# Patient Record
Sex: Female | Born: 1945
Health system: Southern US, Community
[De-identification: ages and names within clinical notes are randomized; demographics above are authoritative.]

## PROBLEM LIST (undated history)

## (undated) DIAGNOSIS — H35039 Hypertensive retinopathy, unspecified eye: Secondary | ICD-10-CM

## (undated) DIAGNOSIS — I4891 Unspecified atrial fibrillation: Secondary | ICD-10-CM

## (undated) DIAGNOSIS — H269 Unspecified cataract: Secondary | ICD-10-CM

## (undated) DIAGNOSIS — H3530 Unspecified macular degeneration: Secondary | ICD-10-CM

## (undated) DIAGNOSIS — K5190 Ulcerative colitis, unspecified, without complications: Secondary | ICD-10-CM

## (undated) DIAGNOSIS — H8109 Meniere's disease, unspecified ear: Secondary | ICD-10-CM

## (undated) DIAGNOSIS — I7300 Raynaud's syndrome without gangrene: Secondary | ICD-10-CM

## (undated) DIAGNOSIS — I499 Cardiac arrhythmia, unspecified: Secondary | ICD-10-CM

## (undated) HISTORY — DX: Unspecified atrial fibrillation: I48.91

## (undated) HISTORY — DX: Cardiac arrhythmia, unspecified: I49.9

## (undated) HISTORY — DX: Unspecified macular degeneration: H35.30

## (undated) HISTORY — PX: TONSILLECTOMY AND ADENOIDECTOMY: SUR1326

## (undated) HISTORY — DX: Hypertensive retinopathy, unspecified eye: H35.039

## (undated) HISTORY — DX: Ulcerative colitis, unspecified, without complications: K51.90

## (undated) HISTORY — DX: Raynaud's syndrome without gangrene: I73.00

## (undated) HISTORY — DX: Meniere's disease, unspecified ear: H81.09

## (undated) HISTORY — DX: Unspecified cataract: H26.9

---

## 2011-12-01 ENCOUNTER — Encounter: Payer: Self-pay | Admitting: *Deleted

## 2011-12-01 ENCOUNTER — Ambulatory Visit (INDEPENDENT_AMBULATORY_CARE_PROVIDER_SITE_OTHER): Payer: Medicare Other | Admitting: Cardiology

## 2011-12-01 ENCOUNTER — Inpatient Hospital Stay: Admission: RE | Admit: 2011-12-01 | Payer: Self-pay | Source: Ambulatory Visit

## 2011-12-01 ENCOUNTER — Encounter: Payer: Self-pay | Admitting: Cardiology

## 2011-12-01 VITALS — BP 168/82 | HR 60 | Ht 62.0 in | Wt 127.0 lb

## 2011-12-01 DIAGNOSIS — IMO0002 Reserved for concepts with insufficient information to code with codable children: Secondary | ICD-10-CM | POA: Insufficient documentation

## 2011-12-01 DIAGNOSIS — R1900 Intra-abdominal and pelvic swelling, mass and lump, unspecified site: Secondary | ICD-10-CM

## 2011-12-01 DIAGNOSIS — I10 Essential (primary) hypertension: Secondary | ICD-10-CM | POA: Insufficient documentation

## 2011-12-01 DIAGNOSIS — I4891 Unspecified atrial fibrillation: Secondary | ICD-10-CM

## 2011-12-01 MED ORDER — METOPROLOL TARTRATE 25 MG PO TABS: 25.0000 mg | ORAL_TABLET | Freq: Two times a day (BID) | ORAL | Status: DC

## 2011-12-01 MED ORDER — FLECAINIDE ACETATE 50 MG PO TABS: 50.0000 mg | ORAL_TABLET | Freq: Two times a day (BID) | ORAL | Status: DC

## 2011-12-01 NOTE — Patient Instructions (Addendum)
Your physician recommends that you schedule a follow-up appointment in: 1 year. You will receive a reminder letter in the mail in about 10 months reminding you to call and schedule your appointment. If you don't receive this letter, please contact our office.   Your physician recommends that you continue on your current medications as directed. Please refer to the Current Medication list given to you today.   Your physician has requested that you have an abdominal duplex. During this test, an ultrasound is used to evaluate the aorta. Allow 30 minutes for this exam. Do not eat after midnight the day before and avoid carbonated beverages

## 2011-12-01 NOTE — Assessment & Plan Note (Signed)
Her blood pressure is elevated but this is unusual. She will continue with surveillance and we instructed her on a blood pressure diary. Further treatment will be based on these results.

## 2011-12-01 NOTE — Progress Notes (Signed)
HPI The patient presents as a new patient. She has a long history of paroxysmal atrial fibrillation. She develops this rhythm when she is sick and she has been ill a few times this year with flulike illnesses. She has been treated with low-dose flecainide. She has not tolerated higher doses. She has more recently been put on a low dose of beta blocker. She has had an extensive workup by another cardiology group. Most recently in 2011 echo was unremarkable. Stress perfusion study demonstrated a preserved ejection fraction with no ischemia or infarct. She was hospitalized earlier this year with a flulike illness and had atrial fibrillation. She is active. She walks her driveway which is a long driveway and with an incline frequently. The patient denies any new symptoms such as chest discomfort, neck or arm discomfort. There has been no new shortness of breath, PND or orthopnea. There have been no reported palpitations, presyncope or syncope.  Allergies  Allergen Reactions  . Penicillins   . Asa (Aspirin) Swelling    Throat,eyes  . Bactrim (Sulfamethoxazole W-Trimethoprim) Swelling    face  . Biaxin (Clarithromycin) Swelling    face  . Cortisone Swelling    face  . Erythromycin Other (See Comments)    Sores on face  . Tylenol (Acetaminophen) Swelling    face    Current Outpatient Prescriptions  Medication Sig Dispense Refill  . flecainide (TAMBOCOR) 50 MG tablet Take 50 mg by mouth 2 (two) times daily.      . meclizine (ANTIVERT) 12.5 MG tablet Take 12.5 mg by mouth 3 (three) times daily as needed.      . metoprolol tartrate (LOPRESSOR) 25 MG tablet Take 25 mg by mouth 2 (two) times daily.        Past Medical History  Diagnosis Date  . Arrhythmia     afib    No past surgical history on file.  No family history on file.  History   Social History  . Marital Status: Married    Spouse Name: N/A    Number of Children: N/A  . Years of Education: N/A   Occupational History  .  Not on file.   Social History Main Topics  . Smoking status: Former Smoker -- 1.0 packs/day for 10 years    Types: Cigarettes    Quit date: 06/29/1969  . Smokeless tobacco: Never Used  . Alcohol Use: Not on file  . Drug Use: Not on file  . Sexually Active: Not on file   Other Topics Concern  . Not on file   Social History Narrative  . No narrative on file    ROS:  As stated in the HPI and negative for all other systems.   PHYSICAL EXAM BP 168/82  Pulse 60  Ht 5\' 2"  (1.575 m)  Wt 127 lb (57.607 kg)  BMI 23.23 kg/m2 GENERAL:  Well appearing HEENT:  Pupils equal round and reactive, fundi not visualized, oral mucosa unremarkable NECK:  No jugular venous distention, waveform within normal limits, carotid upstroke brisk and symmetric, no bruits, no thyromegaly LYMPHATICS:  No cervical, inguinal adenopathy LUNGS:  Clear to auscultation bilaterally BACK:  No CVA tenderness CHEST:  Unremarkable HEART:  PMI not displaced or sustained,S1 and S2 within normal limits, no S3, no S4, no clicks, no rubs, no murmurs ABD:  Flat, positive bowel sounds normal in frequency in pitch, no bruits, no rebound, no guarding, possible midline pulsatile mass, no hepatomegaly, no splenomegaly EXT:  2 plus pulses throughout, no  edema, no cyanosis no clubbing SKIN:  No rashes no nodules NEURO:  Cranial nerves II through XII grossly intact, motor grossly intact throughout PSYCH:  Cognitively intact, oriented to person place and time   EKG:  Normal sinus rhythm, rate 58, leftward axis, nonspecific anterior T wave changes. 12/01/11  ASSESSMENT AND PLAN

## 2011-12-01 NOTE — Assessment & Plan Note (Signed)
Given her family history and this finding she needs to be screened with an abdominal ultrasound.

## 2011-12-01 NOTE — Assessment & Plan Note (Signed)
Currently she is on reasonable therapy. She has no significant risk for thromboembolism so systemic anticoagulation is not indicated. She will continue medications as listed.

## 2011-12-08 ENCOUNTER — Telehealth: Payer: Self-pay | Admitting: Cardiology

## 2011-12-08 MED ORDER — METOPROLOL SUCCINATE ER 25 MG PO TB24: 50.0000 mg | ORAL_TABLET | Freq: Every day | ORAL | Status: DC

## 2011-12-08 NOTE — Telephone Encounter (Signed)
New msg Pt just got metoprolol rx and the pills are different. Please call her back

## 2011-12-08 NOTE — Telephone Encounter (Signed)
Pt reports that she has been taking Metoprolol succinate 25 mg tablets - two a day in the AM - when she picked up her last RX she was given tartrate.  A corrected RX will be sent into pharmacy

## 2011-12-09 ENCOUNTER — Other Ambulatory Visit: Payer: Self-pay

## 2011-12-09 ENCOUNTER — Other Ambulatory Visit: Payer: Self-pay | Admitting: Cardiology

## 2011-12-09 DIAGNOSIS — R1900 Intra-abdominal and pelvic swelling, mass and lump, unspecified site: Secondary | ICD-10-CM

## 2011-12-30 ENCOUNTER — Telehealth: Payer: Self-pay | Admitting: *Deleted

## 2011-12-30 ENCOUNTER — Other Ambulatory Visit: Payer: Self-pay | Admitting: Cardiology

## 2011-12-30 DIAGNOSIS — R1900 Intra-abdominal and pelvic swelling, mass and lump, unspecified site: Secondary | ICD-10-CM

## 2011-12-30 NOTE — Telephone Encounter (Signed)
Message copied by Megan Salon on Wed Dec 30, 2011  5:11 PM ------      Message from: Eustace Moore      Created: Mon Dec 07, 2011 10:08 AM      Regarding: ABD U/S       Have you gotten any word about whether or not this test can be scheduled?

## 2011-12-30 NOTE — Telephone Encounter (Signed)
Checking with Harriett in the Vascular Lab as to how to place this order.

## 2012-01-01 NOTE — Telephone Encounter (Signed)
Noted  

## 2012-01-07 ENCOUNTER — Encounter (INDEPENDENT_AMBULATORY_CARE_PROVIDER_SITE_OTHER): Payer: Medicare Other

## 2012-01-07 DIAGNOSIS — R1900 Intra-abdominal and pelvic swelling, mass and lump, unspecified site: Secondary | ICD-10-CM

## 2012-01-07 DIAGNOSIS — I10 Essential (primary) hypertension: Secondary | ICD-10-CM

## 2012-01-12 ENCOUNTER — Telehealth: Payer: Self-pay | Admitting: *Deleted

## 2012-01-12 NOTE — Telephone Encounter (Signed)
Patient informed. 

## 2012-01-12 NOTE — Telephone Encounter (Signed)
Message copied by Eustace Moore on Tue Jan 12, 2012 10:49 AM ------      Message from: Rollene Rotunda      Created: Mon Jan 11, 2012 11:38 AM       No evidence of AAA.  Normal study.

## 2012-12-05 ENCOUNTER — Encounter: Payer: Self-pay | Admitting: Cardiology

## 2012-12-05 ENCOUNTER — Ambulatory Visit (INDEPENDENT_AMBULATORY_CARE_PROVIDER_SITE_OTHER): Payer: Medicare Other | Admitting: Cardiology

## 2012-12-05 VITALS — BP 176/76 | HR 89 | Ht 61.0 in | Wt 130.8 lb

## 2012-12-05 DIAGNOSIS — I10 Essential (primary) hypertension: Secondary | ICD-10-CM

## 2012-12-05 DIAGNOSIS — I4891 Unspecified atrial fibrillation: Secondary | ICD-10-CM

## 2012-12-05 MED ORDER — METOPROLOL SUCCINATE ER 25 MG PO TB24: 50.0000 mg | ORAL_TABLET | Freq: Every day | ORAL | Status: DC

## 2012-12-05 MED ORDER — MECLIZINE HCL 12.5 MG PO TABS: 12.5000 mg | ORAL_TABLET | Freq: Three times a day (TID) | ORAL | Status: DC | PRN

## 2012-12-05 MED ORDER — FLECAINIDE ACETATE 50 MG PO TABS: 50.0000 mg | ORAL_TABLET | Freq: Two times a day (BID) | ORAL | Status: DC

## 2012-12-05 NOTE — Patient Instructions (Addendum)
Your physician recommends that you schedule a follow-up appointment in: 1 year. You will receive a reminder letter in the mail in about 10 months reminding you to call and schedule your appointment. If you don't receive this letter, please contact our office. Your physician recommends that you continue on your current medications as directed. Please refer to the Current Medication list given to you today. Your physician recommends that you return for lab work today for [employer], and BMET. Please have this done at [facility].

## 2012-12-05 NOTE — Progress Notes (Signed)
HPI The patient presents as a new patient. She has a long history of paroxysmal atrial fibrillation. She develops this rhythm when she is sick and she has been ill a few times this year with flulike illnesses. She has been treated with low-dose flecainide. She has not tolerated higher doses. She has also been on a low dose of beta blocker. She has had an extensive workup by another cardiology group. Most recently in 2011 echo was unremarkable. Stress perfusion study demonstrated a preserved ejection fraction with no ischemia or infarct. She was hospitalized earlier this year with a flulike illness and had atrial fibrillation.   Since I last saw her she has been getting some occasional morning palpitations.  These happens sporadically. She'll go back to lay down to go away.  She cannot bring this on. She does not have any presyncope or syncope. She hasn't had any sustained tachycardia arrhythmias. She denies any chest pressure, neck or arm discomfort. She has no shortness of breath, PND or orthopnea.  Allergies  Allergen Reactions  . Penicillins   . Asa (Aspirin) Swelling    Throat,eyes  . Bactrim (Sulfamethoxazole W-Trimethoprim) Swelling    face  . Biaxin (Clarithromycin) Swelling    face  . Cortisone Swelling    face  . Erythromycin Other (See Comments)    Sores on face  . Tylenol (Acetaminophen) Swelling    face    Current Outpatient Prescriptions  Medication Sig Dispense Refill  . flecainide (TAMBOCOR) 50 MG tablet Take 1 tablet (50 mg total) by mouth 2 (two) times daily.  180 tablet  3  . meclizine (ANTIVERT) 12.5 MG tablet Take 12.5 mg by mouth 3 (three) times daily as needed.      . metoprolol succinate (TOPROL-XL) 25 MG 24 hr tablet Take 2 tablets (50 mg total) by mouth daily.  180 tablet  3   No current facility-administered medications for this visit.    Past Medical History  Diagnosis Date  . Arrhythmia     afib  . Ulcerative colitis   . Raynaud disease   . Meniere  disease     Past Surgical History  Procedure Laterality Date  . Tonsillectomy and adenoidectomy      Family History  Problem Relation Age of Onset  . Aortic aneurysm Mother   . Aortic aneurysm Maternal Aunt   . Lung disease Father   . Coronary artery disease Paternal Grandmother 5    died    History   Social History  . Marital Status: Married    Spouse Name: N/A    Number of Children: 4  . Years of Education: N/A   Occupational History  . Not on file.   Social History Main Topics  . Smoking status: Former Smoker -- 1.00 packs/day for 10 years    Types: Cigarettes    Quit date: 06/29/1969  . Smokeless tobacco: Never Used  . Alcohol Use: Not on file  . Drug Use: Not on file  . Sexually Active: Not on file   Other Topics Concern  . Not on file   Social History Narrative   Lives with husband and oldest son.          ROS:  As stated in the HPI and negative for all other systems.   PHYSICAL EXAM BP 176/76  Pulse 89  Ht 5\' 1"  (1.549 m)  Wt 130 lb 12.8 oz (59.33 kg)  BMI 24.73 kg/m2  SpO2 96% GENERAL:  Well appearing HEENT:  Pupils equal round and reactive, fundi not visualized, oral mucosa unremarkable NECK:  No jugular venous distention, waveform within normal limits, carotid upstroke brisk and symmetric, no bruits, no thyromegaly LYMPHATICS:  No cervical, inguinal adenopathy LUNGS:  Clear to auscultation bilaterally BACK:  No CVA tenderness CHEST:  Unremarkable HEART:  PMI not displaced or sustained,S1 and S2 within normal limits, no S3, no S4, no clicks, no rubs, no murmurs ABD:  Flat, positive bowel sounds normal in frequency in pitch, no bruits, no rebound, no guarding, possible midline pulsatile mass, no hepatomegaly, no splenomegaly EXT:  2 plus pulses throughout, no edema, no cyanosis no clubbing SKIN:  No rashes no nodules NEURO:  Cranial nerves II through XII grossly intact, motor grossly intact throughout PSYCH:  Cognitively intact, oriented to  person place and time   EKG:  Normal sinus rhythm, rate 60, leftward axis, nonspecific anterior T wave changes. No significant change from previous. 12/01/11  ASSESSMENT AND PLAN   ATRIAL FIBRILLATION:  She's had no sustained paroxysms symptomatic this. She does have some a.m. Palpitations. I will have her split her Toprol to one pill in the morning and 1 in the evening to see if this helps. I will be checking a TSH magnesium and potassium today.  HTN:  BP OK.  Continue current therapy.

## 2012-12-08 ENCOUNTER — Telehealth: Payer: Self-pay | Admitting: *Deleted

## 2012-12-08 NOTE — Telephone Encounter (Signed)
Message copied by Eustace Moore on Thu Dec 08, 2012  9:37 AM ------      Message from: Rollene Rotunda      Created: Wed Dec 07, 2012  1:43 PM       Labs OK.  Call Ms. Towery with the results and send results to [facility], MD       ------

## 2012-12-08 NOTE — Telephone Encounter (Signed)
Patient informed and copy sent to PCP. 

## 2013-02-01 ENCOUNTER — Other Ambulatory Visit: Payer: Self-pay

## 2013-05-04 ENCOUNTER — Other Ambulatory Visit: Payer: Self-pay

## 2013-12-06 ENCOUNTER — Other Ambulatory Visit: Payer: Self-pay | Admitting: *Deleted

## 2013-12-06 MED ORDER — FLECAINIDE ACETATE 50 MG PO TABS: 50.0000 mg | ORAL_TABLET | Freq: Two times a day (BID) | ORAL | Status: DC

## 2013-12-06 MED ORDER — METOPROLOL SUCCINATE ER 25 MG PO TB24: 50.0000 mg | ORAL_TABLET | Freq: Every day | ORAL | Status: DC

## 2013-12-22 ENCOUNTER — Encounter: Payer: Self-pay | Admitting: Cardiology

## 2013-12-22 ENCOUNTER — Ambulatory Visit (INDEPENDENT_AMBULATORY_CARE_PROVIDER_SITE_OTHER): Payer: Medicare Other | Admitting: Cardiology

## 2013-12-22 VITALS — BP 148/84 | HR 67 | Ht 61.0 in | Wt 129.0 lb

## 2013-12-22 DIAGNOSIS — I480 Paroxysmal atrial fibrillation: Secondary | ICD-10-CM

## 2013-12-22 DIAGNOSIS — I4891 Unspecified atrial fibrillation: Secondary | ICD-10-CM

## 2013-12-22 MED ORDER — FLECAINIDE ACETATE 50 MG PO TABS: 50.0000 mg | ORAL_TABLET | Freq: Two times a day (BID) | ORAL | Status: DC

## 2013-12-22 MED ORDER — METOPROLOL SUCCINATE ER 25 MG PO TB24: 25.0000 mg | ORAL_TABLET | Freq: Two times a day (BID) | ORAL | Status: DC

## 2013-12-22 NOTE — Patient Instructions (Signed)
The current medical regimen is effective;  continue present plan and medications.  Follow up in 1 year with Dr. Hochrein in the [place] office.  You will receive a letter in the mail 2 months before you are due.  Please call us when you receive this letter to schedule your follow up appointment.  

## 2013-12-22 NOTE — Progress Notes (Signed)
   HPI The patient presents for followup of atrial fibrillation.   She has a long history of paroxysmal atrial fibrillation. She develops this rhythm when she is sick. She has been treated with low-dose flecainide. She has not tolerated higher doses. She has also been on a low dose of beta blocker. She has had an extensive workup by another cardiology group. Most recently in 2011 echo was unremarkable. Stress perfusion study demonstrated a preserved ejection fraction with no ischemia or infarct.   Since I last saw her she has been getting some occasional morning palpitations.  The she had one episode of chest pain and pain going down her arm in the morning. However, this was an isolated event it was self limited. She's otherwise the walking for exercise or pushing the vacuum if she has not had any further chest discomfort, neck or arm discomfort. She's had no presyncope or syncope.  Allergies  Allergen Reactions  . Penicillins   . Asa [Aspirin] Swelling    Throat,eyes  . Bactrim [Sulfamethoxazole-Trimethoprim] Swelling    face  . Biaxin [Clarithromycin] Swelling    face  . Cortisone Swelling    face  . Erythromycin Other (See Comments)    Sores on face  . Tylenol [Acetaminophen] Swelling    face    Current Outpatient Prescriptions  Medication Sig Dispense Refill  . flecainide (TAMBOCOR) 50 MG tablet Take 1 tablet (50 mg total) by mouth 2 (two) times daily.  60 tablet  0  . meclizine (ANTIVERT) 12.5 MG tablet Take 1 tablet (12.5 mg total) by mouth 3 (three) times daily as needed.  30 tablet  3  . metoprolol succinate (TOPROL-XL) 25 MG 24 hr tablet Take 25 mg by mouth 2 (two) times daily.       No current facility-administered medications for this visit.    Past Medical History  Diagnosis Date  . Arrhythmia     afib  . Ulcerative colitis   . Raynaud disease   . Meniere disease     Past Surgical History  Procedure Laterality Date  . Tonsillectomy and adenoidectomy      ROS:   Recent "head" infection.  Otherwise as stated in the HPI and negative for all other systems.   PHYSICAL EXAM BP 148/84  Pulse 67  Ht 5\' 1"  (1.549 m)  Wt 129 lb (58.514 kg)  BMI 24.39 kg/m2 GENERAL:  Well appearing NECK:  No jugular venous distention, waveform within normal limits, carotid upstroke brisk and symmetric, no bruits, no thyromegaly LUNGS:  Clear to auscultation bilaterally CHEST:  Unremarkable HEART:  PMI not displaced or sustained,S1 and S2 within normal limits, no S3, no S4, no clicks, no rubs, no murmurs ABD:  Flat, positive bowel sounds normal in frequency in pitch, no bruits, no rebound, no guarding, possible midline pulsatile mass, no hepatomegaly, no splenomegaly EXT:  2 plus pulses throughout, no edema, no cyanosis no clubbing SKIN:  No rashes no nodules   EKG:  Normal sinus rhythm, rate 60, leftward axis, nonspecific anterior T wave changes. No significant change from previous. 12/01/11   ASSESSMENT AND PLAN  ATRIAL FIBRILLATION:  She's had no sustained paroxysms symptomatic this. She does still have some AM palpitations. I will not change the meds   HTN:  BP is slightly elevated.  However this is not usual.  Continue current therapy.  BP readings at home are OK and she will continue to watch these.

## 2014-01-23 ENCOUNTER — Other Ambulatory Visit: Payer: Self-pay | Admitting: *Deleted

## 2014-01-23 MED ORDER — MECLIZINE HCL 12.5 MG PO TABS: 12.5000 mg | ORAL_TABLET | Freq: Three times a day (TID) | ORAL | Status: DC | PRN

## 2015-01-01 ENCOUNTER — Other Ambulatory Visit: Payer: Self-pay | Admitting: Cardiology

## 2015-01-01 DIAGNOSIS — I480 Paroxysmal atrial fibrillation: Secondary | ICD-10-CM

## 2015-01-01 MED ORDER — METOPROLOL SUCCINATE ER 25 MG PO TB24: 25.0000 mg | ORAL_TABLET | Freq: Two times a day (BID) | ORAL | Status: DC

## 2015-01-01 MED ORDER — FLECAINIDE ACETATE 50 MG PO TABS: 50.0000 mg | ORAL_TABLET | Freq: Two times a day (BID) | ORAL | Status: DC

## 2015-01-01 NOTE — Telephone Encounter (Signed)
Needs refill on flecainide (TAMBOCOR) 50 MG tablet [[id number]][[id number]] & metoprolol succinate (TOPROL-XL) 25 MG 24 hr tablet [[id number]][[id number]] [facility]

## 2015-01-01 NOTE — Telephone Encounter (Signed)
Medication sent to pharmacy  

## 2015-01-29 DIAGNOSIS — M546 Pain in thoracic spine: Secondary | ICD-10-CM | POA: Diagnosis not present

## 2015-01-29 DIAGNOSIS — I480 Paroxysmal atrial fibrillation: Secondary | ICD-10-CM | POA: Diagnosis not present

## 2015-01-29 DIAGNOSIS — K219 Gastro-esophageal reflux disease without esophagitis: Secondary | ICD-10-CM | POA: Diagnosis not present

## 2015-01-29 DIAGNOSIS — M810 Age-related osteoporosis without current pathological fracture: Secondary | ICD-10-CM | POA: Diagnosis not present

## 2015-02-01 DIAGNOSIS — M549 Dorsalgia, unspecified: Secondary | ICD-10-CM | POA: Diagnosis not present

## 2015-02-01 DIAGNOSIS — M47812 Spondylosis without myelopathy or radiculopathy, cervical region: Secondary | ICD-10-CM | POA: Diagnosis not present

## 2015-02-01 DIAGNOSIS — M5385 Other specified dorsopathies, thoracolumbar region: Secondary | ICD-10-CM | POA: Diagnosis not present

## 2015-02-01 DIAGNOSIS — M810 Age-related osteoporosis without current pathological fracture: Secondary | ICD-10-CM | POA: Diagnosis not present

## 2015-02-07 ENCOUNTER — Ambulatory Visit: Payer: No Typology Code available for payment source | Admitting: Cardiology

## 2015-02-13 ENCOUNTER — Encounter: Payer: Self-pay | Admitting: Cardiovascular Disease

## 2015-02-13 ENCOUNTER — Ambulatory Visit (INDEPENDENT_AMBULATORY_CARE_PROVIDER_SITE_OTHER): Payer: Commercial Managed Care - HMO | Admitting: Cardiovascular Disease

## 2015-02-13 ENCOUNTER — Telehealth: Payer: Self-pay | Admitting: *Deleted

## 2015-02-13 VITALS — BP 158/80 | HR 59 | Ht 61.0 in | Wt 130.0 lb

## 2015-02-13 DIAGNOSIS — I10 Essential (primary) hypertension: Secondary | ICD-10-CM

## 2015-02-13 DIAGNOSIS — R0602 Shortness of breath: Secondary | ICD-10-CM

## 2015-02-13 DIAGNOSIS — R5383 Other fatigue: Secondary | ICD-10-CM | POA: Diagnosis not present

## 2015-02-13 DIAGNOSIS — I480 Paroxysmal atrial fibrillation: Secondary | ICD-10-CM

## 2015-02-13 DIAGNOSIS — Z8249 Family history of ischemic heart disease and other diseases of the circulatory system: Secondary | ICD-10-CM

## 2015-02-13 NOTE — Telephone Encounter (Signed)
Error

## 2015-02-13 NOTE — Progress Notes (Signed)
Patient ID: Cassidy Miller, female   DOB: 02-09-1946, 69 y.o.   MRN: [id number]      SUBJECTIVE: The patient presents for followup of paroxysmal atrial fibrillation.This is my first time meeting her as a patient. Her husband, Twana First, is also my patient and recently underwent CABG.   She has a long history of paroxysmal atrial fibrillation. She reportedly develops this rhythm when she is sick. She has been treated with low-dose flecainide as she has not tolerated higher doses. She has also been on a low dose of beta blocker. She has had an extensive workup by another cardiology group. Most recently in 2011 echo was unremarkable. Stress perfusion study demonstrated a preserved ejection fraction with no ischemia or infarct.   Additional past medical history includes ulcerative colitis, hypertension, and several issues related to her back which include osteoporosis and scoliosis. She has no family history of premature coronary artery disease. There is a family history of aortic aneurysm and her mother died of a ruptured aortic aneurysm at the age of 33. She has her aorta evaluated by imaging every 3 years by her PCP.   She says she has "not felt great". She denies exertional chest pain. She has felt more fatigued lately but also says she has had a lot of stress in her life. She has 4 children. She occasionally has chest pain which originates in her back and radiates anteriorly. She has had some nausea with this. She occasionally has left arm weakness and left arm aches. When walking on her long driveway, she has to stop when coming back which is uphill. This has been slightly more severe lately.  ECG performed in the office today demonstrates normal sinus rhythm with a nonspecific T wave abnormality.     Review of Systems: As per "subjective", otherwise negative.  Allergies  Allergen Reactions  . Peanut-Containing Drug Products     Lips tingle  . Penicillins   . Asa [Aspirin] Swelling   Throat,eyes  . Bactrim [Sulfamethoxazole-Trimethoprim] Swelling    face  . Biaxin [Clarithromycin] Swelling    face  . Cortisone Swelling    face  . Erythromycin Other (See Comments)    Sores on face  . Tylenol [Acetaminophen] Swelling    face    Current Outpatient Prescriptions  Medication Sig Dispense Refill  . flecainide (TAMBOCOR) 50 MG tablet Take 1 tablet (50 mg total) by mouth 2 (two) times daily. 180 tablet 0  . meclizine (ANTIVERT) 12.5 MG tablet Take 1 tablet (12.5 mg total) by mouth 3 (three) times daily as needed. 30 tablet 5  . metoprolol succinate (TOPROL-XL) 25 MG 24 hr tablet Take 1 tablet (25 mg total) by mouth 2 (two) times daily. 180 tablet 0   No current facility-administered medications for this visit.    Past Medical History  Diagnosis Date  . Arrhythmia     afib  . Ulcerative colitis   . Raynaud disease   . Meniere disease     Past Surgical History  Procedure Laterality Date  . Tonsillectomy and adenoidectomy      Social History   Social History  . Marital Status: Married    Spouse Name: N/A  . Number of Children: 4  . Years of Education: N/A   Occupational History  . Not on file.   Social History Main Topics  . Smoking status: Former Smoker -- 1.00 packs/day for 10 years    Types: Cigarettes    Quit date: 06/29/1969  .  Smokeless tobacco: Never Used  . Alcohol Use: Not on file  . Drug Use: Not on file  . Sexual Activity: Not on file   Other Topics Concern  . Not on file   Social History Narrative   Lives with husband and oldest son.           Filed Vitals:   02/13/15 0937  BP: 158/80  Pulse: 59  Height:  (1.549 m)  Weight: 130 lb (58.968 kg)  SpO2: 95%    PHYSICAL EXAM General: NAD HEENT: Normal. Neck: No JVD, no thyromegaly. Lungs: Clear to auscultation bilaterally with normal respiratory effort. CV: Nondisplaced PMI.  Regular rate and rhythm, normal S1/S2, no S3/S4, no murmur. No pretibial or periankle  edema.  No carotid bruit.  Normal pedal pulses.  Abdomen: Soft, nontender, no hepatosplenomegaly, no distention.  Neurologic: Alert and oriented x 3.  Psych: Normal affect. Skin: Normal. Musculoskeletal: Normal range of motion, no gross deformities. Extremities: No clubbing or cyanosis.   ECG: Most recent ECG reviewed.      ASSESSMENT AND PLAN: 1. Paroxysmal atrial fibrillation: No recent recurrences (at least 2 years). Reportedly occurs whenever she has the flu. Has refused anticoagulation in the past and presently. Continue flecainide and metoprolol.  2. Essential HTN: Elevated today but says this was due to anxiety/stress. Will monitor.  3. Shortness of breath and fatigue: If symptoms progress, would consider stress testing.  4. Family h/o aortic aneurysm rupture: Monitored with imaging every 3 years by PCP.  Dispo: f/u 1 year.  Time spent: 40 minutes, of which greater than 50% was spent reviewing symptoms, relevant blood tests and studies, and discussing management plan with the patient.   Prentice Docker, M.D., F.A.C.C.

## 2015-02-13 NOTE — Patient Instructions (Signed)
Continue all current medications. Call with update in 2 months on how your shortness of breath is doing.  Your physician wants you to follow up in:  1 year.  You will receive a reminder letter in the mail one-two months in advance.  If you don't receive a letter, please call our office to schedule the follow up appointment

## 2015-02-14 DIAGNOSIS — Z79899 Other long term (current) drug therapy: Secondary | ICD-10-CM | POA: Diagnosis not present

## 2015-02-14 DIAGNOSIS — Z87891 Personal history of nicotine dependence: Secondary | ICD-10-CM | POA: Diagnosis not present

## 2015-02-14 DIAGNOSIS — M810 Age-related osteoporosis without current pathological fracture: Secondary | ICD-10-CM | POA: Diagnosis not present

## 2015-02-14 DIAGNOSIS — R42 Dizziness and giddiness: Secondary | ICD-10-CM | POA: Diagnosis not present

## 2015-02-14 DIAGNOSIS — M549 Dorsalgia, unspecified: Secondary | ICD-10-CM | POA: Diagnosis not present

## 2015-02-14 DIAGNOSIS — K5190 Ulcerative colitis, unspecified, without complications: Secondary | ICD-10-CM | POA: Diagnosis not present

## 2015-02-14 DIAGNOSIS — M2550 Pain in unspecified joint: Secondary | ICD-10-CM | POA: Diagnosis not present

## 2015-02-14 DIAGNOSIS — I4891 Unspecified atrial fibrillation: Secondary | ICD-10-CM | POA: Diagnosis not present

## 2015-02-14 DIAGNOSIS — Z780 Asymptomatic menopausal state: Secondary | ICD-10-CM | POA: Diagnosis not present

## 2015-03-15 ENCOUNTER — Telehealth: Payer: Self-pay | Admitting: *Deleted

## 2015-03-15 NOTE — Telephone Encounter (Signed)
Message left on voice mail - calling to give update on how doing.  Returned call to patient.  Stated that her SOB & fatigue has been more noticeable lately.  Stated did have episode about 1 1/2 weeks ago where she had chest pain, back pain going down to arm & thumb and jaw.  Also, had nausea.  Stated it lasted about 1/2 hour.  She did not feel like she needed to go to ED because she said her heart rate was normal.  Also, does not have nitroglycerin.  Stated she has felt okay today.  Advised that message will be sent to provider for advice, but in the meantime if this occurs again, she should call 911/go to ED for evaluation.  Patient verbalized understanding.

## 2015-03-19 NOTE — Telephone Encounter (Signed)
Patient notified.  OV scheduled with Dr. Purvis Sheffield for Monday, 03/25/2015 at 11:20 here in the [place] office.  Reminded to go to ED if symptoms worsen prior to then.  Patient verbalized understanding.

## 2015-03-19 NOTE — Telephone Encounter (Signed)
Can we get her an appointment with a PA later this week or next unless Dr Kirtland Bouchard has openings within 1-2 weeks.  Dominga Ferry MD

## 2015-03-25 ENCOUNTER — Telehealth: Payer: Self-pay | Admitting: Cardiovascular Disease

## 2015-03-25 ENCOUNTER — Encounter: Payer: Self-pay | Admitting: *Deleted

## 2015-03-25 ENCOUNTER — Ambulatory Visit (INDEPENDENT_AMBULATORY_CARE_PROVIDER_SITE_OTHER): Payer: Commercial Managed Care - HMO | Admitting: Cardiovascular Disease

## 2015-03-25 ENCOUNTER — Encounter: Payer: Self-pay | Admitting: Cardiovascular Disease

## 2015-03-25 VITALS — BP 171/82 | HR 63 | Ht 61.0 in | Wt 130.0 lb

## 2015-03-25 DIAGNOSIS — R079 Chest pain, unspecified: Secondary | ICD-10-CM | POA: Diagnosis not present

## 2015-03-25 DIAGNOSIS — I10 Essential (primary) hypertension: Secondary | ICD-10-CM | POA: Diagnosis not present

## 2015-03-25 DIAGNOSIS — Z8249 Family history of ischemic heart disease and other diseases of the circulatory system: Secondary | ICD-10-CM

## 2015-03-25 DIAGNOSIS — I480 Paroxysmal atrial fibrillation: Secondary | ICD-10-CM

## 2015-03-25 DIAGNOSIS — R0602 Shortness of breath: Secondary | ICD-10-CM | POA: Diagnosis not present

## 2015-03-25 DIAGNOSIS — R5383 Other fatigue: Secondary | ICD-10-CM

## 2015-03-25 MED ORDER — METOPROLOL SUCCINATE ER 25 MG PO TB24: 25.0000 mg | ORAL_TABLET | Freq: Two times a day (BID) | ORAL | Status: DC

## 2015-03-25 MED ORDER — AMLODIPINE BESYLATE 5 MG PO TABS: 5.0000 mg | ORAL_TABLET | Freq: Every day | ORAL | Status: DC

## 2015-03-25 MED ORDER — FLECAINIDE ACETATE 50 MG PO TABS: 50.0000 mg | ORAL_TABLET | Freq: Two times a day (BID) | ORAL | Status: DC

## 2015-03-25 NOTE — Telephone Encounter (Signed)
Lexiscan Cardiolite on medications dx: SOB & chest pain Schedule Oct 17 arrive at 9:30

## 2015-03-25 NOTE — Progress Notes (Signed)
Patient ID: Billie Ruddy, female   DOB: 1945-09-11, 69 y.o.   MRN: [id number]      SUBJECTIVE: The patient returns for earlier follow-up as she has been experiencing some chest pain with shortness of breath and fatigue. I saw her most recently on 02/13/15. She had an episode of left scapular pain proximally 2 weeks ago while lying in bed which radiated down her left arm and up the left side of her neck and was accompanied by some nausea. She denies exertional chest pain but had anterior radiation of pain from her back pain. She denies worsening shortness of breath and fatigue. She remains stressed due to her husband, Twana First. She recently had an ocular migraine as well.  Review of Systems: As per "subjective", otherwise negative.  Allergies  Allergen Reactions  . Peanut-Containing Drug Products     Lips tingle  . Penicillins   . Asa [Aspirin] Swelling    Throat,eyes  . Bactrim [Sulfamethoxazole-Trimethoprim] Swelling    face  . Biaxin [Clarithromycin] Swelling    face  . Cortisone Swelling    face  . Erythromycin Other (See Comments)    Sores on face  . Tylenol [Acetaminophen] Swelling    face    Current Outpatient Prescriptions  Medication Sig Dispense Refill  . flecainide (TAMBOCOR) 50 MG tablet Take 1 tablet (50 mg total) by mouth 2 (two) times daily. 180 tablet 0  . meclizine (ANTIVERT) 12.5 MG tablet Take 1 tablet (12.5 mg total) by mouth 3 (three) times daily as needed. 30 tablet 5  . metoprolol succinate (TOPROL-XL) 25 MG 24 hr tablet Take 1 tablet (25 mg total) by mouth 2 (two) times daily. 180 tablet 0   No current facility-administered medications for this visit.    Past Medical History  Diagnosis Date  . Arrhythmia     afib  . Ulcerative colitis   . Raynaud disease   . Meniere disease     Past Surgical History  Procedure Laterality Date  . Tonsillectomy and adenoidectomy      Social History   Social History  . Marital Status: Married    Spouse Name:  N/A  . Number of Children: 4  . Years of Education: N/A   Occupational History  . Not on file.   Social History Main Topics  . Smoking status: Former Smoker -- 1.00 packs/day for 10 years    Types: Cigarettes    Start date: 06/30/1959    Quit date: 06/29/1969  . Smokeless tobacco: Never Used  . Alcohol Use: Not on file  . Drug Use: Not on file  . Sexual Activity: Not on file   Other Topics Concern  . Not on file   Social History Narrative   Lives with husband and oldest son.           Filed Vitals:   03/25/15 1115  BP: 171/82  Pulse: 63  Height:  (1.549 m)  Weight: 130 lb (58.968 kg)    PHYSICAL EXAM General: NAD HEENT: Normal. Neck: No JVD, no thyromegaly. Lungs: Clear to auscultation bilaterally with normal respiratory effort. CV: Nondisplaced PMI.  Regular rate and rhythm, normal S1/S2, no S3/S4, no murmur. No pretibial or periankle edema.  No carotid bruit.  Normal pedal pulses.  Abdomen: Soft, nontender, no hepatosplenomegaly, no distention.  Neurologic: Alert and oriented x 3.  Psych: Normal affect. Skin: Normal. Musculoskeletal: Normal range of motion, no gross deformities. Extremities: No clubbing or cyanosis.   ECG: Most recent  ECG reviewed.      ASSESSMENT AND PLAN: 1. Paroxysmal atrial fibrillation: No recent recurrences (at least 2 years). Reportedly occurs whenever she has the flu. Has refused anticoagulation in the past and presently. Continue flecainide and metoprolol.  2. Essential HTN: Elevated again today. Will start amlodipine 5 mg daily.  3. Chest pain, shortness of breath and fatigue: Will proceed with nuclear stress testing.  4. Family h/o aortic aneurysm rupture: Monitored with imaging every 3 years by PCP.  Dispo: f/u 1 month.   Prentice Docker, M.D., F.A.C.C.

## 2015-03-25 NOTE — Patient Instructions (Signed)
Your physician has recommended you make the following change in your medication:  Start amlodipine 5 mg daily. Continue all other medications the same. Your physician has requested that you have a lexiscan myoview. For further information please visit [email or web address] Please follow instruction sheet, as given. Your physician recommends that you schedule a follow-up appointment in: 1 month.

## 2015-03-27 NOTE — Telephone Encounter (Signed)
[employer] ZOXW#[id number] exp 09-21-15

## 2015-04-15 ENCOUNTER — Encounter (HOSPITAL_COMMUNITY): Payer: Commercial Managed Care - HMO

## 2015-04-15 ENCOUNTER — Ambulatory Visit (HOSPITAL_COMMUNITY): Payer: Commercial Managed Care - HMO

## 2015-05-06 ENCOUNTER — Ambulatory Visit: Payer: Commercial Managed Care - HMO | Admitting: Cardiovascular Disease

## 2016-03-20 ENCOUNTER — Other Ambulatory Visit: Payer: Self-pay | Admitting: Cardiovascular Disease

## 2016-03-20 DIAGNOSIS — I480 Paroxysmal atrial fibrillation: Secondary | ICD-10-CM

## 2016-03-20 MED ORDER — METOPROLOL SUCCINATE ER 25 MG PO TB24: 25.0000 mg | ORAL_TABLET | Freq: Two times a day (BID) | ORAL | 0 refills | Status: DC

## 2016-03-20 MED ORDER — FLECAINIDE ACETATE 50 MG PO TABS
50.0000 mg | ORAL_TABLET | Freq: Two times a day (BID) | ORAL | 0 refills | Status: DC
Start: 2016-03-20 — End: 2016-05-15

## 2016-03-20 NOTE — Telephone Encounter (Signed)
metoprolol succinate (TOPROL-XL) 25 MG 24 hr tablet   flecainide (TAMBOCOR) 50 MG tablet   meclizine (ANTIVERT) 12.5 MG tablet   [facility]

## 2016-03-20 NOTE — Telephone Encounter (Signed)
Metoprolol and Flecainide sent to pharmacy, meclizine is non cardiac medication

## 2016-05-15 ENCOUNTER — Encounter: Payer: Self-pay | Admitting: Cardiovascular Disease

## 2016-05-15 ENCOUNTER — Ambulatory Visit (INDEPENDENT_AMBULATORY_CARE_PROVIDER_SITE_OTHER): Payer: Commercial Managed Care - HMO | Admitting: Cardiovascular Disease

## 2016-05-15 VITALS — BP 150/86 | HR 58 | Ht 61.0 in | Wt 130.0 lb

## 2016-05-15 DIAGNOSIS — I10 Essential (primary) hypertension: Secondary | ICD-10-CM | POA: Diagnosis not present

## 2016-05-15 DIAGNOSIS — I480 Paroxysmal atrial fibrillation: Secondary | ICD-10-CM | POA: Diagnosis not present

## 2016-05-15 DIAGNOSIS — Z8249 Family history of ischemic heart disease and other diseases of the circulatory system: Secondary | ICD-10-CM | POA: Diagnosis not present

## 2016-05-15 MED ORDER — METOPROLOL SUCCINATE ER 25 MG PO TB24: 25.0000 mg | ORAL_TABLET | Freq: Two times a day (BID) | ORAL | 3 refills | Status: DC

## 2016-05-15 MED ORDER — FLECAINIDE ACETATE 50 MG PO TABS: 50.0000 mg | ORAL_TABLET | Freq: Two times a day (BID) | ORAL | 3 refills | Status: DC

## 2016-05-15 NOTE — Progress Notes (Signed)
SUBJECTIVE: The patient presents for follow-up of paroxysmal atrial fibrillation, hypertension, and chest pain. She has a history of noncompliance with treatment regimen.  ECG performed in the office today which I personally reviewed demonstrates normal sinus rhythm with no ischemic ST segment or T-wave abnormalities, nor any arrhythmias.  She said her blood pressure is likely elevated due to stress from taking care of both her husband and her adult son who has Crohn's disease and lives with them. She denies chest pain and shortness of breath. She has palpitations more frequently when she is sleep deprived.   Soc: Husband is Twana FirstJohn Burns who is also my patient.  Review of Systems: As per "subjective", otherwise negative.  Allergies  Allergen Reactions  . Peanut-Containing Drug Products     Lips tingle  . Penicillins   . Asa [Aspirin] Swelling    Throat,eyes  . Bactrim [Sulfamethoxazole-Trimethoprim] Swelling    face  . Biaxin [Clarithromycin] Swelling    face  . Cortisone Swelling    face  . Erythromycin Other (See Comments)    Sores on face  . Tylenol [Acetaminophen] Swelling    face    Current Outpatient Prescriptions  Medication Sig Dispense Refill  . flecainide (TAMBOCOR) 50 MG tablet Take 1 tablet (50 mg total) by mouth 2 (two) times daily. 120 tablet 0  . meclizine (ANTIVERT) 25 MG tablet Take 25 mg by mouth 3 (three) times daily as needed for dizziness.    . metoprolol succinate (TOPROL-XL) 25 MG 24 hr tablet Take 1 tablet (25 mg total) by mouth 2 (two) times daily. 120 tablet 0   No current facility-administered medications for this visit.     Past Medical History:  Diagnosis Date  . Arrhythmia    afib  . Meniere disease   . Raynaud disease   . [facility]     Past Surgical History:  Procedure Laterality Date  . TONSILLECTOMY AND ADENOIDECTOMY      Social History   Social History  . Marital status: Married    Spouse name: N/A  .  Number of children: 4  . Years of education: N/A   Occupational History  . Not on file.   Social History Main Topics  . Smoking status: Former Smoker    Packs/day: 1.00    Years: 10.00    Types: Cigarettes    Start date: 06/30/1959    Quit date: 06/29/1969  . Smokeless tobacco: Never Used  . Alcohol use Not on file  . Drug use: Unknown  . Sexual activity: Not on file   Other Topics Concern  . Not on file   Social History Narrative   Lives with husband and oldest son.           Vitals:   05/15/16 1055  BP: (!) 150/86  Pulse: (!) 58  SpO2: 95%  Weight: 130 lb (59 kg)  Height: 5\' 1"  (1.549 m)    PHYSICAL EXAM General: NAD HEENT: Normal. Neck: No JVD, no thyromegaly. Lungs: Clear to auscultation bilaterally with normal respiratory effort. CV: Nondisplaced PMI.  Regular rate and rhythm, normal S1/S2, no S3/S4, no murmur. No pretibial or periankle edema.  No carotid bruit.   Abdomen: Soft, nontender, no distention.  Neurologic: Alert and oriented.  Psych: Normal affect. Skin: Normal. Musculoskeletal: No gross deformities.    ECG: Most recent ECG reviewed.      ASSESSMENT AND PLAN: 1. Paroxysmal atrial fibrillation: No recent recurrences. Reportedly occurs whenever she  has the flu or is sleep deprived. Has refused anticoagulation. Continue flecainide and metoprolol.  2. Essential HTN: Elevated again today. I previously started amlodipine 5 mg daily but she is not taking it. I informed her regarding the risk associated with HTN and stroke and MI. She does not want therapy.  3. Chest pain, shortness of breath and fatigue: Previously ordered a nuclear stress test but she did not have it performed. No symptoms at present.  4. Family h/o aortic aneurysm rupture: Monitored with imaging every 3 years by PCP.  Dispo: f/u 1 year.   Prentice DockerSuresh Bernerd Terhune, M.D., F.A.C.C.

## 2016-05-15 NOTE — Addendum Note (Signed)
Addended by: Lesle ChrisHILL, Makylie Rivere G on: 05/15/2016 11:24 AM   Modules accepted: Orders

## 2016-05-15 NOTE — Patient Instructions (Signed)

## 2016-07-21 DIAGNOSIS — J209 Acute bronchitis, unspecified: Secondary | ICD-10-CM | POA: Diagnosis not present

## 2016-07-21 DIAGNOSIS — J111 Influenza due to unidentified influenza virus with other respiratory manifestations: Secondary | ICD-10-CM | POA: Diagnosis not present

## 2016-07-23 DIAGNOSIS — Z882 Allergy status to sulfonamides status: Secondary | ICD-10-CM | POA: Diagnosis not present

## 2016-07-23 DIAGNOSIS — R002 Palpitations: Secondary | ICD-10-CM | POA: Diagnosis not present

## 2016-07-23 DIAGNOSIS — J189 Pneumonia, unspecified organism: Secondary | ICD-10-CM | POA: Diagnosis not present

## 2016-07-23 DIAGNOSIS — J101 Influenza due to other identified influenza virus with other respiratory manifestations: Secondary | ICD-10-CM | POA: Diagnosis not present

## 2016-07-23 DIAGNOSIS — J440 Chronic obstructive pulmonary disease with acute lower respiratory infection: Secondary | ICD-10-CM | POA: Diagnosis not present

## 2016-07-23 DIAGNOSIS — Z883 Allergy status to other anti-infective agents status: Secondary | ICD-10-CM | POA: Diagnosis not present

## 2016-07-23 DIAGNOSIS — R05 Cough: Secondary | ICD-10-CM | POA: Diagnosis not present

## 2016-07-23 DIAGNOSIS — Z881 Allergy status to other antibiotic agents status: Secondary | ICD-10-CM | POA: Diagnosis not present

## 2016-07-23 DIAGNOSIS — Z880 Allergy status to penicillin: Secondary | ICD-10-CM | POA: Diagnosis not present

## 2016-07-23 DIAGNOSIS — Z886 Allergy status to analgesic agent status: Secondary | ICD-10-CM | POA: Diagnosis not present

## 2016-07-23 DIAGNOSIS — I4891 Unspecified atrial fibrillation: Secondary | ICD-10-CM | POA: Diagnosis not present

## 2016-07-23 LAB — BASIC METABOLIC PANEL
BUN: 19 (ref 4–21)
CREATININE: 0.7 (ref <=1.1)

## 2016-08-28 ENCOUNTER — Encounter: Payer: Self-pay | Admitting: *Deleted

## 2016-08-31 ENCOUNTER — Encounter: Payer: Self-pay | Admitting: Cardiovascular Disease

## 2016-08-31 ENCOUNTER — Ambulatory Visit (INDEPENDENT_AMBULATORY_CARE_PROVIDER_SITE_OTHER): Payer: Medicare HMO | Admitting: Cardiovascular Disease

## 2016-08-31 VITALS — BP 176/84 | HR 59 | Ht 61.0 in | Wt 129.8 lb

## 2016-08-31 DIAGNOSIS — Z5181 Encounter for therapeutic drug level monitoring: Secondary | ICD-10-CM | POA: Diagnosis not present

## 2016-08-31 DIAGNOSIS — Z79899 Other long term (current) drug therapy: Secondary | ICD-10-CM

## 2016-08-31 DIAGNOSIS — Z8249 Family history of ischemic heart disease and other diseases of the circulatory system: Secondary | ICD-10-CM

## 2016-08-31 DIAGNOSIS — Z9289 Personal history of other medical treatment: Secondary | ICD-10-CM

## 2016-08-31 DIAGNOSIS — R002 Palpitations: Secondary | ICD-10-CM

## 2016-08-31 DIAGNOSIS — I10 Essential (primary) hypertension: Secondary | ICD-10-CM

## 2016-08-31 DIAGNOSIS — I480 Paroxysmal atrial fibrillation: Secondary | ICD-10-CM | POA: Diagnosis not present

## 2016-08-31 MED ORDER — AMLODIPINE BESYLATE 5 MG PO TABS: 5.0000 mg | ORAL_TABLET | Freq: Every day | ORAL | 6 refills | Status: DC

## 2016-08-31 NOTE — Progress Notes (Signed)
SUBJECTIVE: The patient presents for premature follow-up of paroxysmal atrial fibrillation, hypertension, and chest pain. She has a history of noncompliance with treatment regimen.  She was hospitalized at [facility] for palpitations after testing positive for influenza. She was noted to have rapid atrial fibrillation with heart rates greater than 140 bpm in late January 2018. Flecainide was increased to 100 mg twice daily and then cut back to 50 mg twice daily. Troponins were normal. Potassium was low normal at 3.5. White count 6, hemoglobin 15.5  ECG showed rapid atrial fibrillation, heart rate 166 bpm. Chest x-ray showed streaky left lower lobe opacity which may represent atelectasis or pneumonia. This was on 07/23/16.  She is feeling better now but has not completely regained her strength. She was having episodic palpitations until last Thursday. Blood pressure is 176/84. She has several questions about her hospitalization. She denies chest pain and leg swelling.   Soc: Husband is Twana First who is also my patient.   Review of Systems: As per "subjective", otherwise negative.  Allergies  Allergen Reactions  . Peanut-Containing Drug Products     Lips tingle  . Penicillins   . Asa [Aspirin] Swelling    Throat,eyes  . Bactrim [Sulfamethoxazole-Trimethoprim] Swelling    face  . Biaxin [Clarithromycin] Swelling    face  . Cortisone Swelling    face  . Erythromycin Other (See Comments)    Sores on face  . Tylenol [Acetaminophen] Swelling    face    Current Outpatient Prescriptions  Medication Sig Dispense Refill  . flecainide (TAMBOCOR) 50 MG tablet Take 1 tablet (50 mg total) by mouth 2 (two) times daily. 180 tablet 3  . meclizine (ANTIVERT) 25 MG tablet Take 25 mg by mouth 3 (three) times daily as needed for dizziness.    . metoprolol succinate (TOPROL-XL) 25 MG 24 hr tablet Take 1 tablet (25 mg total) by mouth 2 (two) times daily. 180 tablet 3   No current  facility-administered medications for this visit.     Past Medical History:  Diagnosis Date  . Arrhythmia    afib  . Atrial fibrillation (HCC)   . Meniere disease   . Raynaud disease   . Ulcerative colitis [facility])     Past Surgical History:  Procedure Laterality Date  . TONSILLECTOMY AND ADENOIDECTOMY      Social History   Social History  . Marital status: Married    Spouse name: N/A  . Number of children: 4  . Years of education: N/A   Occupational History  . Not on file.   Social History Main Topics  . Smoking status: Former Smoker    Packs/day: 1.00    Years: 10.00    Types: Cigarettes    Start date: 06/30/1959    Quit date: 06/29/1969  . Smokeless tobacco: Never Used  . Alcohol use Not on file  . Drug use: Unknown  . Sexual activity: Not on file   Other Topics Concern  . Not on file   Social History Narrative   Lives with husband and oldest son.           Vitals:   08/31/16 1434  BP: (!) 176/84  Pulse: (!) 59  SpO2: 95%  Weight: 129 lb 12.8 oz (58.9 kg)  Height: 5\' 1"  (1.549 m)    PHYSICAL EXAM General: NAD HEENT: Normal. Neck: No JVD, no thyromegaly. Lungs: Clear to auscultation bilaterally with normal respiratory effort. CV: Nondisplaced PMI.  Regular rate  and rhythm, normal S1/S2, no S3/S4, no murmur. No pretibial or periankle edema.  No carotid bruit.   Abdomen: Soft, nontender, no distention.  Neurologic: Alert and oriented.  Psych: Normal affect. Skin: Normal. Musculoskeletal: No gross deformities.    ECG: Most recent ECG reviewed.      ASSESSMENT AND PLAN: 1. Paroxysmal atrial fibrillation: Symptomatically stable. Reportedly occurs whenever she has the flu or is sleep deprived. Has refused anticoagulation. Continue flecainide and metoprolol. Will check flecainide level.  2. Essential HTN: Elevated again today. I previously started amlodipine 5 mg daily but she is not taking it. I informed her regarding the risk associated with  HTN and stroke and MI.  She has now agreed to amlodipine 5 mg daily.  3. Chest pain, shortness of breath and fatigue: Previously ordered a nuclear stress test but she did not have it performed. No symptoms at present.  4. Family h/o aortic aneurysm rupture: Monitored with imaging every 3 years by PCP.  Dispo: f/u 6 months.  Time spent: 40 minutes, of which greater than 50% was spent reviewing symptoms, relevant blood tests and studies, and discussing management plan with the patient.    Prentice DockerSuresh Koneswaran, M.D., F.A.C.C.

## 2016-08-31 NOTE — Patient Instructions (Signed)
Medication Instructions:   Begin Amlodipine 5mg  daily.  Continue all other medications.    Labwork:  Flecainide level - order given today.  Lab needs to be drawn just prior to morning dose.  Office will contact with results via phone or letter.    Testing/Procedures: none  Follow-Up: Your physician wants you to follow up in: 6 months.  You will receive a reminder letter in the mail one-two months in advance.  If you don't receive a letter, please call our office to schedule the follow up appointment   Any Other Special Instructions Will Be Listed Below (If Applicable).  If you need a refill on your cardiac medications before your next appointment, please call your pharmacy.

## 2016-12-08 ENCOUNTER — Encounter: Payer: Self-pay | Admitting: *Deleted

## 2017-02-17 DIAGNOSIS — B0229 Other postherpetic nervous system involvement: Secondary | ICD-10-CM | POA: Diagnosis not present

## 2017-02-17 DIAGNOSIS — H8113 Benign paroxysmal vertigo, bilateral: Secondary | ICD-10-CM | POA: Diagnosis not present

## 2017-02-17 DIAGNOSIS — M810 Age-related osteoporosis without current pathological fracture: Secondary | ICD-10-CM | POA: Diagnosis not present

## 2017-02-17 DIAGNOSIS — I480 Paroxysmal atrial fibrillation: Secondary | ICD-10-CM | POA: Diagnosis not present

## 2017-02-17 DIAGNOSIS — Z6822 Body mass index (BMI) 22.0-22.9, adult: Secondary | ICD-10-CM | POA: Diagnosis not present

## 2017-02-17 DIAGNOSIS — M549 Dorsalgia, unspecified: Secondary | ICD-10-CM | POA: Diagnosis not present

## 2017-03-05 ENCOUNTER — Encounter: Payer: Self-pay | Admitting: Cardiovascular Disease

## 2017-03-05 ENCOUNTER — Ambulatory Visit (INDEPENDENT_AMBULATORY_CARE_PROVIDER_SITE_OTHER): Payer: Medicare HMO | Admitting: Cardiovascular Disease

## 2017-03-05 ENCOUNTER — Encounter: Payer: Self-pay | Admitting: *Deleted

## 2017-03-05 VITALS — BP 150/84 | HR 63 | Ht 61.0 in | Wt 124.0 lb

## 2017-03-05 DIAGNOSIS — S22080A Wedge compression fracture of T11-T12 vertebra, initial encounter for closed fracture: Secondary | ICD-10-CM | POA: Diagnosis not present

## 2017-03-05 DIAGNOSIS — M545 Low back pain: Secondary | ICD-10-CM | POA: Diagnosis not present

## 2017-03-05 DIAGNOSIS — Z8249 Family history of ischemic heart disease and other diseases of the circulatory system: Secondary | ICD-10-CM

## 2017-03-05 DIAGNOSIS — S32010A Wedge compression fracture of first lumbar vertebra, initial encounter for closed fracture: Secondary | ICD-10-CM | POA: Diagnosis not present

## 2017-03-05 DIAGNOSIS — M549 Dorsalgia, unspecified: Secondary | ICD-10-CM | POA: Diagnosis not present

## 2017-03-05 DIAGNOSIS — Z79899 Other long term (current) drug therapy: Secondary | ICD-10-CM

## 2017-03-05 DIAGNOSIS — M50322 Other cervical disc degeneration at C5-C6 level: Secondary | ICD-10-CM | POA: Diagnosis not present

## 2017-03-05 DIAGNOSIS — I10 Essential (primary) hypertension: Secondary | ICD-10-CM | POA: Diagnosis not present

## 2017-03-05 DIAGNOSIS — I480 Paroxysmal atrial fibrillation: Secondary | ICD-10-CM

## 2017-03-05 DIAGNOSIS — M546 Pain in thoracic spine: Secondary | ICD-10-CM | POA: Diagnosis not present

## 2017-03-05 DIAGNOSIS — M810 Age-related osteoporosis without current pathological fracture: Secondary | ICD-10-CM | POA: Diagnosis not present

## 2017-03-05 DIAGNOSIS — Z5181 Encounter for therapeutic drug level monitoring: Secondary | ICD-10-CM | POA: Diagnosis not present

## 2017-03-05 NOTE — Progress Notes (Signed)
SUBJECTIVE: The patient presents for follow-up of paroxysmal atrial fibrillation, hypertension, and chest pain. She has a history of noncompliance with treatment regimen.  I prescribed amlodipine for hypertension at last visit but she is not taking it. She denies chest pain, palpitations, leg swelling, shortness of breath. She developed shingles of her right forearm and hand over the summer. She still has some residual neuropathy.  She reportedly had a flecainide level checked this past summer but I do not have a copy of these results.    Soc: Husband is Twana First who is also my patient.   Review of Systems: As per "subjective", otherwise negative.  Allergies  Allergen Reactions  . Peanut-Containing Drug Products     Lips tingle  . Penicillins   . Asa [Aspirin] Swelling    Throat,eyes  . Bactrim [Sulfamethoxazole-Trimethoprim] Swelling    face  . Biaxin [Clarithromycin] Swelling    face  . Cortisone Swelling    face  . Erythromycin Other (See Comments)    Sores on face  . Tylenol [Acetaminophen] Swelling    face    Current Outpatient Prescriptions  Medication Sig Dispense Refill  . flecainide (TAMBOCOR) 50 MG tablet Take 1 tablet (50 mg total) by mouth 2 (two) times daily. 180 tablet 3  . meclizine (ANTIVERT) 25 MG tablet Take 25 mg by mouth 3 (three) times daily as needed for dizziness.    . metoprolol succinate (TOPROL-XL) 25 MG 24 hr tablet Take 1 tablet (25 mg total) by mouth 2 (two) times daily. 180 tablet 3   No current facility-administered medications for this visit.     Past Medical History:  Diagnosis Date  . Arrhythmia    afib  . Atrial fibrillation (HCC)   . Meniere disease   . Raynaud disease   . Ulcerative colitis [facility])     Past Surgical History:  Procedure Laterality Date  . TONSILLECTOMY AND ADENOIDECTOMY      Social History   Social History  . Marital status: Married    Spouse name: N/A  . Number of children: 4  . Years of  education: N/A   Occupational History  . Not on file.   Social History Main Topics  . Smoking status: Former Smoker    Packs/day: 1.00    Years: 10.00    Types: Cigarettes    Start date: 06/30/1959    Quit date: 06/29/1969  . Smokeless tobacco: Never Used  . Alcohol use Not on file  . Drug use: Unknown  . Sexual activity: Not on file   Other Topics Concern  . Not on file   Social History Narrative   Lives with husband and oldest son.           Vitals:   03/05/17 0849  BP: (!) 150/84  Pulse: 63  SpO2: 96%  Weight: 124 lb (56.2 kg)  Height:  (1.549 m)    Wt Readings from Last 3 Encounters:  03/05/17 124 lb (56.2 kg)  08/31/16 129 lb 12.8 oz (58.9 kg)  05/15/16 130 lb (59 kg)     PHYSICAL EXAM General: NAD HEENT: Normal. Neck: No JVD, no thyromegaly. Lungs: Clear to auscultation bilaterally with normal respiratory effort. CV: Nondisplaced PMI.  Regular rate and rhythm, normal S1/S2, no S3/S4, no murmur. No pretibial or periankle edema.  No carotid bruit.   Abdomen: Soft, nontender, no distention.  Neurologic: Alert and oriented.  Psych: Normal affect. Skin: Normal. Musculoskeletal: No gross deformities.  ECG: Most recent ECG reviewed.   Labs: No results found for: K, BUN, CREATININE, ALT, TSH, HGB   Lipids: No results found for: LDLCALC, LDLDIRECT, CHOL, TRIG, HDL     ASSESSMENT AND PLAN:  1. Paroxysmal atrial fibrillation: Symptomatically stable. Reportedly occurs whenever she has the flu or is sleep deprived. Has refused anticoagulation. Continue flecainide and metoprolol. I will [facility] a [facility]  2. Hypertension: Remains elevated. I have twice prescribed amlodipine but she is not taking it. I asked her to check her blood pressure 3 times per week over the next 4-6 weeks. She said it was normal at her PCPs office last week.  3. Chest pain, shortness of breath and fatigue: Previously  ordered a nuclear stress test but she did not have it performed. No symptoms at present.  4. Family h/o aortic aneurysm rupture: Monitored with imaging every 3 years by PCP.     Disposition: Follow up 1 yr   Prentice DockerSuresh Helia Haese, M.D., F.A.C.C.

## 2017-03-05 NOTE — Patient Instructions (Signed)

## 2017-03-12 DIAGNOSIS — M549 Dorsalgia, unspecified: Secondary | ICD-10-CM | POA: Diagnosis not present

## 2017-03-12 DIAGNOSIS — Z6822 Body mass index (BMI) 22.0-22.9, adult: Secondary | ICD-10-CM | POA: Diagnosis not present

## 2017-03-12 DIAGNOSIS — Z1389 Encounter for screening for other disorder: Secondary | ICD-10-CM | POA: Diagnosis not present

## 2017-03-12 DIAGNOSIS — S32009S Unspecified fracture of unspecified lumbar vertebra, sequela: Secondary | ICD-10-CM | POA: Diagnosis not present

## 2017-03-12 DIAGNOSIS — S22009A Unspecified fracture of unspecified thoracic vertebra, initial encounter for closed fracture: Secondary | ICD-10-CM | POA: Diagnosis not present

## 2017-03-12 DIAGNOSIS — M810 Age-related osteoporosis without current pathological fracture: Secondary | ICD-10-CM | POA: Diagnosis not present

## 2017-04-06 DIAGNOSIS — M8000XA Age-related osteoporosis with current pathological fracture, unspecified site, initial encounter for fracture: Secondary | ICD-10-CM | POA: Diagnosis not present

## 2017-04-06 DIAGNOSIS — S22000A Wedge compression fracture of unspecified thoracic vertebra, initial encounter for closed fracture: Secondary | ICD-10-CM | POA: Diagnosis not present

## 2017-04-14 ENCOUNTER — Other Ambulatory Visit: Payer: Self-pay | Admitting: Neurosurgery

## 2017-04-14 DIAGNOSIS — S22000A Wedge compression fracture of unspecified thoracic vertebra, initial encounter for closed fracture: Secondary | ICD-10-CM

## 2017-04-29 ENCOUNTER — Ambulatory Visit
Admission: RE | Admit: 2017-04-29 | Discharge: 2017-04-29 | Disposition: A | Discharge status: Home / Self Care | Payer: Medicare HMO | Source: Ambulatory Visit | Attending: Neurosurgery | Admitting: Neurosurgery

## 2017-04-29 DIAGNOSIS — M5125 Other intervertebral disc displacement, thoracolumbar region: Secondary | ICD-10-CM | POA: Diagnosis not present

## 2017-04-29 DIAGNOSIS — S22000A Wedge compression fracture of unspecified thoracic vertebra, initial encounter for closed fracture: Secondary | ICD-10-CM

## 2017-05-17 ENCOUNTER — Encounter: Payer: Self-pay | Admitting: "Endocrinology

## 2017-05-17 ENCOUNTER — Ambulatory Visit: Payer: Medicare HMO | Admitting: "Endocrinology

## 2017-05-17 ENCOUNTER — Encounter: Payer: Self-pay | Admitting: *Deleted

## 2017-05-17 VITALS — BP 153/84 | HR 56 | Ht 61.0 in | Wt 123.0 lb

## 2017-05-17 DIAGNOSIS — M810 Age-related osteoporosis without current pathological fracture: Secondary | ICD-10-CM | POA: Diagnosis not present

## 2017-05-17 DIAGNOSIS — E039 Hypothyroidism, unspecified: Secondary | ICD-10-CM | POA: Diagnosis not present

## 2017-05-17 NOTE — Progress Notes (Signed)
Consulte Note  Past Medical History:  Diagnosis Date  . Arrhythmia    afib  . Atrial fibrillation (HCC)   . Meniere disease   . Raynaud disease   . Ulcerative colitis [facility])    Past Surgical History:  Procedure Laterality Date  . TONSILLECTOMY AND ADENOIDECTOMY     Social History   Socioeconomic History  . Marital status: Married    Spouse name: None  . Number of children: 4  . Years of education: None  . Highest education level: None  Social Needs  . Financial resource strain: None  . Food insecurity - worry: None  . Food insecurity - inability: None  . Transportation needs - medical: None  . Transportation needs - non-medical: None  Occupational History  . None  Tobacco Use  . Smoking status: Former Smoker    Packs/day: 1.00    Years: 10.00    Pack years: 10.00    Types: Cigarettes    Start date: 06/30/1959    Last attempt to quit: 06/29/1969    Years since quitting: 47.9  . Smokeless tobacco: Never Used  Substance and Sexual Activity  . Alcohol use: No    Alcohol/week: 0.0 oz    Frequency: Never  . Drug use: No  . Sexual activity: None  Other Topics Concern  . None  Social History Narrative   Lives with husband and oldest son.         Outpatient Encounter Medications as of 05/17/2017  Medication Sig  . flecainide (TAMBOCOR) 50 MG tablet Take 1 tablet (50 mg total) by mouth 2 (two) times daily.  . meclizine (ANTIVERT) 25 MG tablet Take 25 mg by mouth 3 (three) times daily as needed for dizziness.  . metoprolol succinate (TOPROL-XL) 25 MG 24 hr tablet Take 1 tablet (25 mg total) by mouth 2 (two) times daily.   No facility-administered encounter medications on file as of 05/17/2017.    ALLERGIES: Allergies  Allergen Reactions  . Peanut-Containing Drug Products     Lips tingle  . Penicillins   . Asa [Aspirin] Swelling    Throat,eyes  . Bactrim [Sulfamethoxazole-Trimethoprim] Swelling    face  . Biaxin [Clarithromycin] Swelling    face  . Cortisone Swelling    face  . Erythromycin Other (See Comments)    Sores on face  . Tylenol [Acetaminophen] Swelling    face    VACCINATION STATUS:  There is no immunization history on file for this patient.   HPI   Cassidy Miller is 71 y.o. female who presents today with a medical history as above. she is being seen in consultation for age-related osteoporosis requested by Lawerance Sabal, [place].  Pt was diagnosed with osteoporosis in 2012. She admits that she has declined various forms of therapy for osteoporosis including yearly IV Reclast due to her "own research". - She never attempted taking any of the bisphosphonate treatment options. - She reports that she was told to have compression fractures of lower thoracic and lumbar vertebrae on MRI done unrelated condition. - She was diagnosed with mild scoliosis. - She reports losing three quarters of an inch from her height.  No dizziness/vertigo/orthostasis.  I reviewed pt's DEXA scans: Date L1-L4 T score DF T score 33% distal Radius  8/198/2016 -4.1 -4.2   08/06/2010 -4.1 -3.8    - She is not on any anti-osteoporotic medication currently. She denies history of vitamin D deficiency nor is she on vitamin D  supplement. I don't have recent vitamin D level in her referral packet. Pt is not on calcium supplements.  She also eats dairy and green, leafy, vegetables and average amount. - She has always been light build, currently weighs 123 pounds. No weight bearing exercises.  No h/o hyper/hypocalcemia. No h/o hyperparathyroidism. No h/o kidney stones. - Labs from 07/23/2016 showed BUN 19 creatinine 0.69, calcium 8.4. - She denies any personal or family history of thyroid dysfunction, adrenal dysfunction. She denies any medical problem of renal insufficiency.  Menopause was at 71 y/o.   Pt does not have a FH of osteoporosis.  I reviewed her chart and she also has a history of  ulcerative colitis which required steroid therapy in the remote past.   Review of Systems  Constitutional: + Recent steady weight, no fatigue, no subjective hyperthermia, no subjective hypothermia Eyes: no blurry vision, no xerophthalmia ENT: no sore throat, no nodules palpated in throat, no dysphagia/odynophagia, no hoarseness Cardiovascular: no Chest Pain, no Shortness of Breath, no palpitations, no leg swelling Respiratory: no cough, no SOB Gastrointestinal: no Nausea/Vomiting/Diarhhea Musculoskeletal: no muscle/joint aches Skin: no rashes Neurological: no tremors, no numbness, no tingling, no dizziness Psychiatric: no depression, no anxiety  Objective:    BP (!) 153/84   Pulse (!) 56   Ht 5\' 1"  (1.549 m)   Wt 123 lb (55.8 kg)   BMI 23.24 kg/m   Wt Readings from Last 3 Encounters:  05/17/17 123 lb (55.8 kg)  03/05/17 124 lb (56.2 kg)  08/31/16 129 lb 12.8 oz (58.9 kg)    Physical Exam  Constitutional: +  Appropriate weight for height, not in acute distress, normal state of mind Eyes: PERRLA, EOMI, no exophthalmos ENT: moist mucous membranes, no thyromegaly, no cervical lymphadenopathy Cardiovascular: normal precordial activity, Regular Rate and Rhythm, no Murmur/Rubs/Gallops Respiratory:  adequate breathing efforts, no gross chest deformity, Clear to auscultation bilaterally Gastrointestinal: abdomen soft, Non -tender, No distension, Bowel Sounds present Musculoskeletal: no gross deformities, strength intact in all four extremities, + mild scoliosis.  Skin: moist, warm, no rashes Neurological: no tremor with outstretched hands, Deep tendon reflexes normal in all four extremities.  Her referral packet includes labs from 07/23/2016 showing BUN 19, creatinine 0.69, calcium 8.4.   Assessment: 1. Osteoporosis  Plan:  - She has severe osteoporosis,  likely postmenopausal .  She will definitely need need therapy. However, she appears to be hesitant to go on medications. She  does not have recent labs to look for secondary causes of osteoporosis. I will send her to lab for PTH/calcium, thyroid function test, repeat CMP, vitamin D. - Discussed about increased risk of fracture the background of osteoporosis. Fracture risk  greatly increased when the T score is lower than -2.5, in her case T score is -4.2  on spine and -3.8 on bilateral femur.  We reviewed her DEXA scans together, and I explained that based on the   spine and femoral T scores, she has an increased risk for fractures.  - We discussed about the different medication classes, benefits and side effects (including atypical fractures and ONJ - no dental workup in progress or planned).  - She has history of ulcerative colitis, however denies history of GERD , potentially making her  a candidate for oral bisphosphonates, so my first choice would be oral  Bisphosphonate, after we rule out secondary course of osteoporosis. - She was offered therapy with zoledronic acid in the past, she reports that she declined that offer this to her  own research.  - She has a choice of  denosumab (Prolia) or Teriparatide   if her insurance cooperates.   - after her blood work, she will be started on vitamin D/calcium if necessary.  - She will return in 1 week to review her labs and treatment decision.  - I did not initiate any new prescriptions today.  - Time spent with the patient: 45 minutes, of which >50% was spent in obtaining information about her symptoms, reviewing her previous labs, evaluations, and treatments, counseling her about her  Osteoporosis and risk of fracture, and developing a workup  Plan.  - I advised patient to maintain close follow up with Lawerance SabalWorley, Miranda, PA for primary care needs. Follow up plan: Return in about 1 week (around 05/24/2017) for labs today.   Marquis LunchGebre Lonya Johannesen, MD [facility] [address] [place], [place] [address] Phone: [phone]  Fax:  [phone]     05/17/2017, 1:36 PM  This note was partially dictated with voice recognition software. Similar sounding words can be transcribed inadequately or may not  be corrected upon review.

## 2017-05-24 ENCOUNTER — Encounter: Payer: Self-pay | Admitting: "Endocrinology

## 2017-05-24 ENCOUNTER — Ambulatory Visit (INDEPENDENT_AMBULATORY_CARE_PROVIDER_SITE_OTHER): Payer: Medicare HMO | Admitting: "Endocrinology

## 2017-05-24 VITALS — BP 162/77 | HR 76 | Ht 61.0 in

## 2017-05-24 DIAGNOSIS — M810 Age-related osteoporosis without current pathological fracture: Secondary | ICD-10-CM | POA: Diagnosis not present

## 2017-05-24 MED ORDER — VITAMIN D (ERGOCALCIFEROL) 1.25 MG (50000 UNIT) PO CAPS: 50000.0000 [IU] | ORAL_CAPSULE | ORAL | 1 refills | Status: DC

## 2017-05-24 MED ORDER — ALENDRONATE SODIUM 70 MG PO TABS: 70.0000 mg | ORAL_TABLET | ORAL | 6 refills | Status: DC

## 2017-05-24 NOTE — Progress Notes (Signed)
Consulte Note  Past Medical History:  Diagnosis Date  . Arrhythmia    afib  . Atrial fibrillation (HCC)   . Meniere disease   . Raynaud disease   . Ulcerative colitis [facility])    Past Surgical History:  Procedure Laterality Date  . TONSILLECTOMY AND ADENOIDECTOMY     Social History   Socioeconomic History  . Marital status: Married    Spouse name: None  . Number of children: 4  . Years of education: None  . Highest education level: None  Social Needs  . Financial resource strain: None  . Food insecurity - worry: None  . Food insecurity - inability: None  . Transportation needs - medical: None  . Transportation needs - non-medical: None  Occupational History  . None  Tobacco Use  . Smoking status: Former Smoker    Packs/day: 1.00    Years: 10.00    Pack years: 10.00    Types: Cigarettes    Start date: 06/30/1959    Last attempt to quit: 06/29/1969    Years since quitting: 47.9  . Smokeless tobacco: Never Used  Substance and Sexual Activity  . Alcohol use: No    Alcohol/week: 0.0 oz    Frequency: Never  . Drug use: No  . Sexual activity: None  Other Topics Concern  . None  Social History Narrative   Lives with husband and oldest son.         Outpatient Encounter Medications as of 05/24/2017  Medication Sig  . alendronate (FOSAMAX) 70 MG tablet Take 1 tablet (70 mg total) by mouth every 7 (seven) days. Take with a full glass of water on an empty stomach.  . flecainide (TAMBOCOR) 50 MG tablet Take 1 tablet (50 mg total) by mouth 2 (two) times daily.  . meclizine (ANTIVERT) 25 MG tablet Take 25 mg by mouth 3 (three) times daily as needed for dizziness.  . metoprolol succinate (TOPROL-XL) 25 MG 24 hr tablet Take 1 tablet (25 mg total) by mouth 2 (two) times daily.  . Vitamin D, Ergocalciferol, (DRISDOL) 50000 units CAPS capsule Take 1 capsule (50,000 Units total) by mouth every 7 (seven) days.   No  facility-administered encounter medications on file as of 05/24/2017.    ALLERGIES: Allergies  Allergen Reactions  . Peanut-Containing Drug Products     Lips tingle  . Penicillins   . Asa [Aspirin] Swelling    Throat,eyes  . Bactrim [Sulfamethoxazole-Trimethoprim] Swelling    face  . Biaxin [Clarithromycin] Swelling    face  . Cortisone Swelling    face  . Erythromycin Other (See Comments)    Sores on face  . Tylenol [Acetaminophen] Swelling    face    VACCINATION STATUS:  There is no immunization history on file for this patient.   HPI   Cassidy Miller is 71 y.o. female who presents today with a medical history as above. she is being seen in f/u for age-related osteoporosis requested by Lawerance Sabal, [place].  Pt was diagnosed with osteoporosis in 2012. She admits that she has declined various forms of therapy for osteoporosis including yearly IV Reclast due to her "own research". - She never attempted taking any of the bisphosphonate treatment options. - She reports that she was told to have compression fractures of lower thoracic and lumbar vertebrae on MRI done unrelated condition. - She was diagnosed with mild scoliosis. - She reports losing three quarters of an inch from her height.  No dizziness/vertigo/orthostasis.  I reviewed pt's DEXA scans: Date L1-L4 T score DF T score 33% distal Radius  8/198/2016 -4.1 -4.2   08/06/2010 -4.1 -3.8    - She is not on any anti-osteoporotic medication currently. She denies history of vitamin D deficiency nor is she on vitamin D supplement. I don't have recent vitamin D level in her referral packet. Pt is not on calcium supplements.  She also eats dairy and green, leafy, vegetables and average amount. - She has always been light build, currently weighs 123 pounds. No weight bearing exercises.  No h/o hyper/hypocalcemia. No h/o hyperparathyroidism. No h/o kidney stones. - Labs from 07/23/2016 showed BUN 19 creatinine 0.69, calcium  8.4. - She denies any personal or family history of thyroid dysfunction, adrenal dysfunction. She denies any medical problem of renal insufficiency.  Menopause was at 71 y/o.   Pt does not have a FH of osteoporosis.  I reviewed her chart and she also has a history of ulcerative colitis which required steroid therapy in the remote past.   Review of Systems  Constitutional: + Recent steady weight, no fatigue, no subjective hyperthermia, no subjective hypothermia Eyes: no blurry vision, no xerophthalmia ENT: no sore throat, no nodules palpated in throat, no dysphagia/odynophagia, no hoarseness Cardiovascular: no Chest Pain, no Shortness of Breath, no palpitations, no leg swelling Respiratory: no cough, no SOB Gastrointestinal: no Nausea/Vomiting/Diarhhea Musculoskeletal: no muscle/joint aches Skin: no rashes Neurological: no tremors, no numbness, no tingling, no dizziness Psychiatric: no depression, no anxiety  Objective:    BP (!) 162/77   Pulse 76   Ht 5\' 1"  (1.549 m)   BMI 23.24 kg/m   Wt Readings from Last 3 Encounters:  05/17/17 123 lb (55.8 kg)  03/05/17 124 lb (56.2 kg)  08/31/16 129 lb 12.8 oz (58.9 kg)    Physical Exam  Constitutional: +  Appropriate weight for height, not in acute distress, normal state of mind Eyes: PERRLA, EOMI, no exophthalmos ENT: moist mucous membranes, no thyromegaly, no cervical lymphadenopathy Cardiovascular: normal precordial activity, Regular Rate and Rhythm, no Murmur/Rubs/Gallops Respiratory:  adequate breathing efforts, no gross chest deformity, Clear to auscultation bilaterally Gastrointestinal: abdomen soft, Non -tender, No distension, Bowel Sounds present Musculoskeletal: no gross deformities, strength intact in all four extremities, + mild scoliosis.  Skin: moist, warm, no rashes Neurological: no tremor with outstretched hands, Deep tendon reflexes normal in all four extremities.  Her referral packet includes labs from 07/23/2016  showing BUN 19, creatinine 0.69, calcium 8.4.   Assessment: 1. Osteoporosis 2. Vitamin D deficiency  Plan:  - She has severe osteoporosis,  likely postmenopausal.  Workup for common secondary causes are so far unremarkable. - She has severe vitamin D deficiency. She'll be initiated on vitamin D supplement with 50,000 units of  ergocalciferol weekly for 6 months .   She will definitely need  Therapy for osteoporosis. - I discussed about increased risk of fracture on  the background of osteoporosis. Fracture risk  greatly increased when the T score is lower than -2.5, in her case T score is -4.2  on spine and -3.8 on bilateral femur. - We discussed about the different medication classes, benefits and side effects (including atypical fractures and ONJ - no dental workup in progress or planned).  - She has history of ulcerative colitis, however denies history of GERD , potentially making her  a candidate for oral bisphosphonates, so my first choice would be oral  Bisphosphonate. - I discussed and initiated Fosamax 70 mg  by mouth weekly. - She is advised on cautious and side effects. - She will have repeat bone density in 1 year with office visit. - I advised her to call me if she has any problem with this medication.  - She has a choice of  denosumab (Prolia) or Teriparatide   if her insurance cooperates.   - I advised patient to maintain close follow up with Lawerance SabalWorley, Miranda, PA for primary care needs. Follow up plan: Return in about 1 year (around 05/24/2018) for follow up with pre-visit labs, follow up with Bone Density to do a month before her next visit.Marquis Lunch.   Gebre Nida, MD [facility] [address] [place], [place] [address] Phone: [phone]  Fax: [phone]     05/24/2017, 1:24 PM  This note was partially dictated with voice recognition software. Similar sounding words can be transcribed inadequately or may not  be corrected  upon review.

## 2017-05-28 ENCOUNTER — Other Ambulatory Visit: Payer: Self-pay | Admitting: Cardiovascular Disease

## 2017-05-28 DIAGNOSIS — I480 Paroxysmal atrial fibrillation: Secondary | ICD-10-CM

## 2017-07-22 DIAGNOSIS — I480 Paroxysmal atrial fibrillation: Secondary | ICD-10-CM | POA: Diagnosis not present

## 2017-07-22 DIAGNOSIS — R0989 Other specified symptoms and signs involving the circulatory and respiratory systems: Secondary | ICD-10-CM | POA: Diagnosis not present

## 2017-07-22 DIAGNOSIS — Z6822 Body mass index (BMI) 22.0-22.9, adult: Secondary | ICD-10-CM | POA: Diagnosis not present

## 2017-07-22 DIAGNOSIS — L209 Atopic dermatitis, unspecified: Secondary | ICD-10-CM | POA: Diagnosis not present

## 2017-08-06 DIAGNOSIS — I6529 Occlusion and stenosis of unspecified carotid artery: Secondary | ICD-10-CM | POA: Diagnosis not present

## 2017-08-06 DIAGNOSIS — R0989 Other specified symptoms and signs involving the circulatory and respiratory systems: Secondary | ICD-10-CM | POA: Diagnosis not present

## 2018-02-21 ENCOUNTER — Other Ambulatory Visit: Payer: Self-pay | Admitting: Cardiovascular Disease

## 2018-02-21 DIAGNOSIS — I480 Paroxysmal atrial fibrillation: Secondary | ICD-10-CM

## 2018-05-24 ENCOUNTER — Ambulatory Visit: Payer: Medicare HMO | Admitting: "Endocrinology

## 2018-05-30 ENCOUNTER — Ambulatory Visit: Payer: Medicare HMO | Admitting: Cardiovascular Disease

## 2018-06-23 IMAGING — MR MR THORACIC SPINE W/O CM
4 of 6 series · 13 of 48 positions shown · non-contrast
Comparison: Radiography 03/05/2017

CLINICAL DATA: Mid back pain radiating to the anterior chest for
several years.

EXAM:
MRI THORACIC SPINE WITHOUT CONTRAST
TECHNIQUE: Multiplanar, multisequence MR imaging of the thoracic spine was
performed. No intravenous contrast was administered.

[Series 4: T2 · sagittal · 4.0mm · 0.35mm/px · 4 of 15 slices shown (1 of 3)]
[im 1/15]
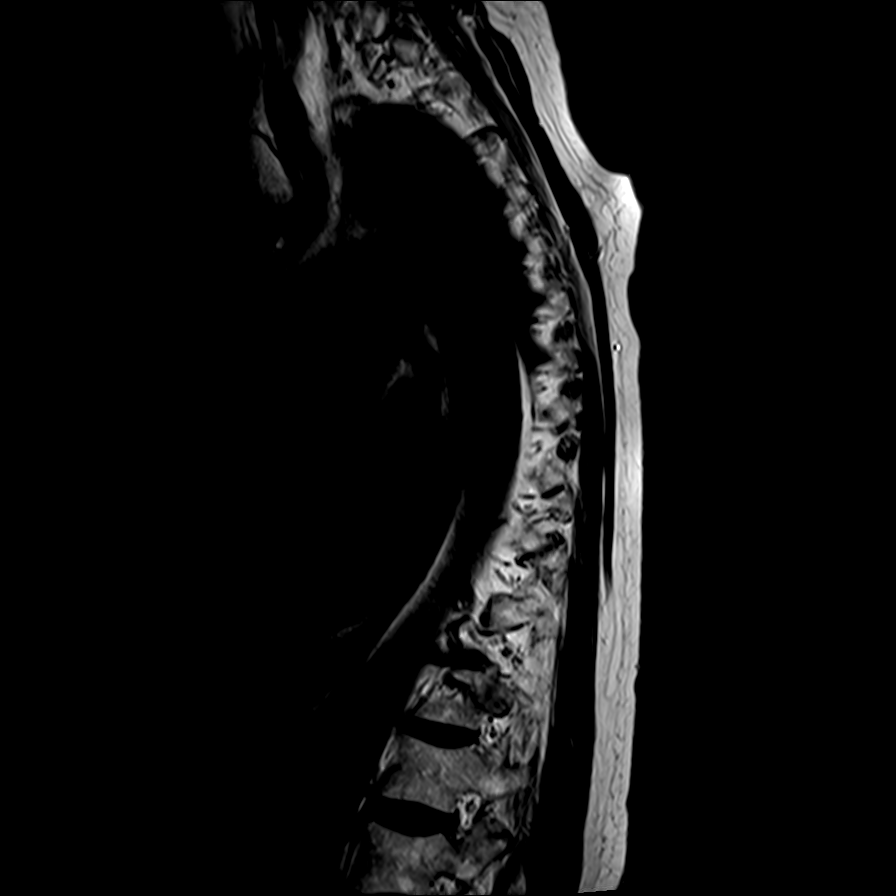
[im 4/15]
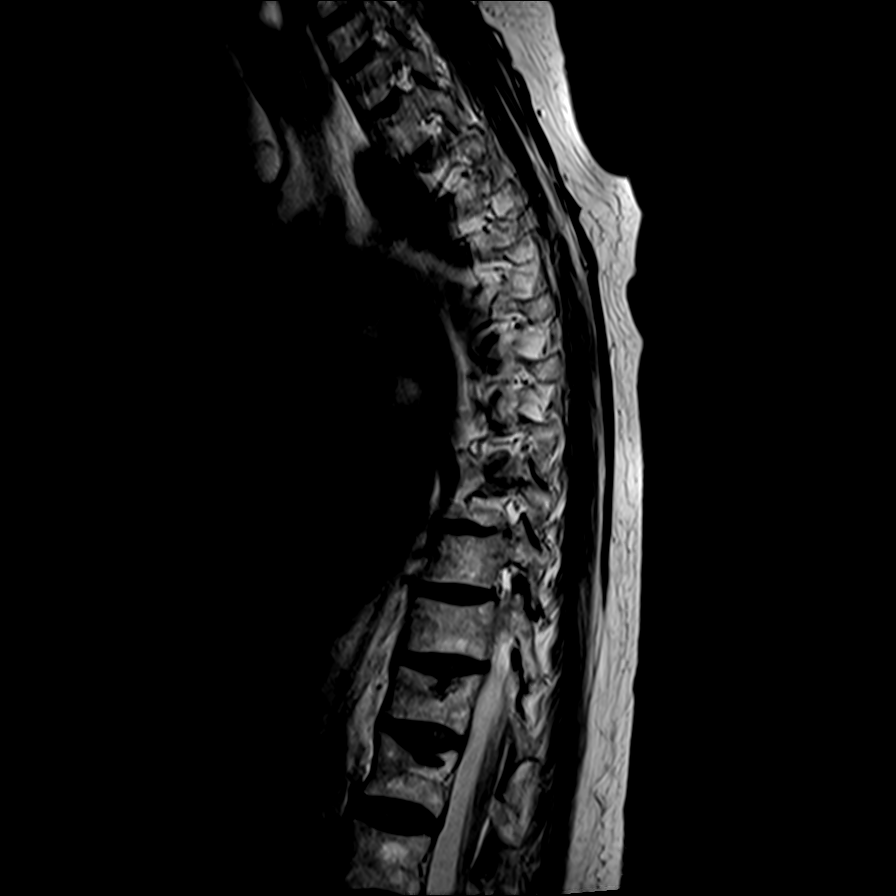
[im 8/15]
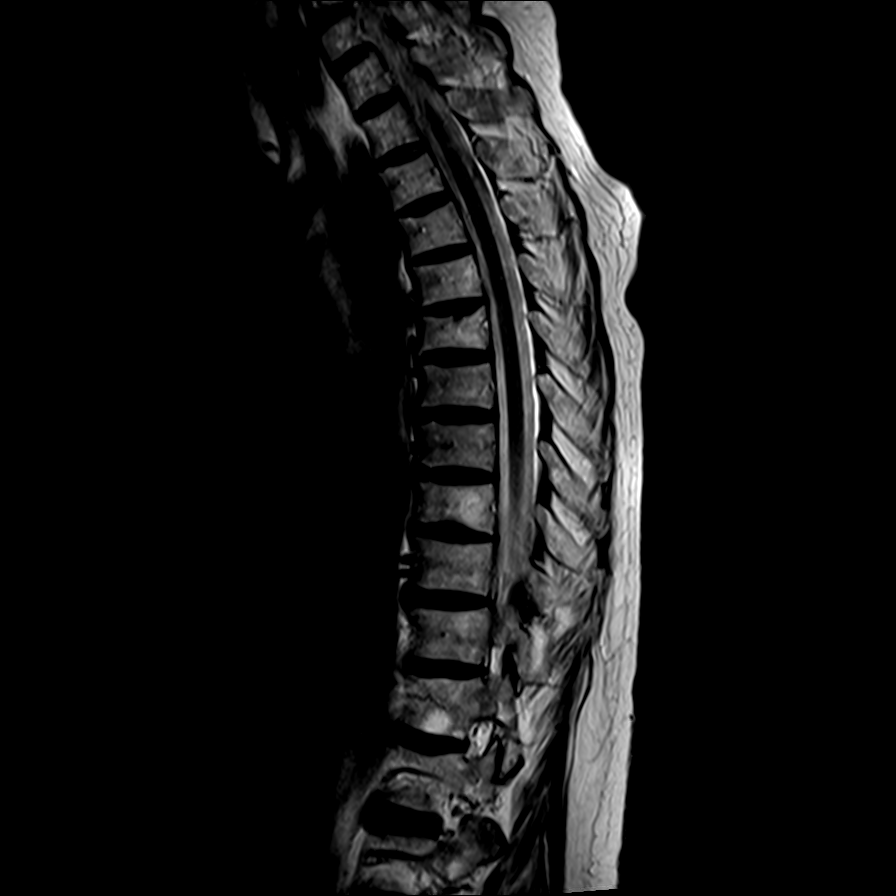
[im 15/15]
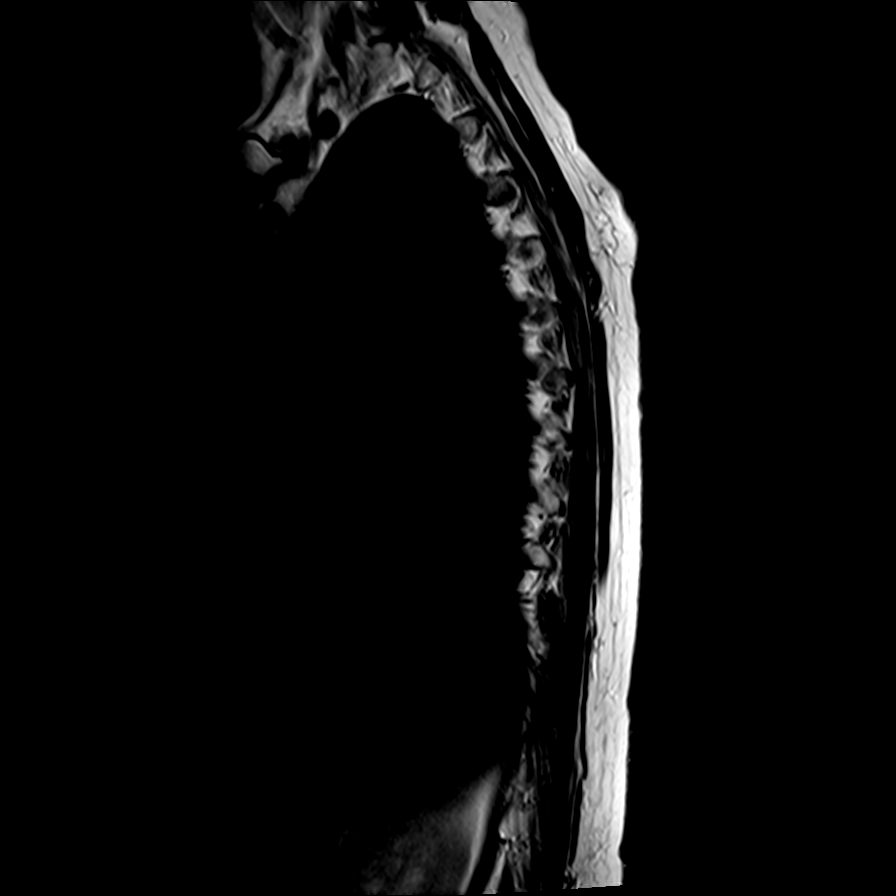

[Series 5: T1 · sagittal · 4.0mm · 0.69mm/px · 3 of 15 slices shown]
[im 1/15]
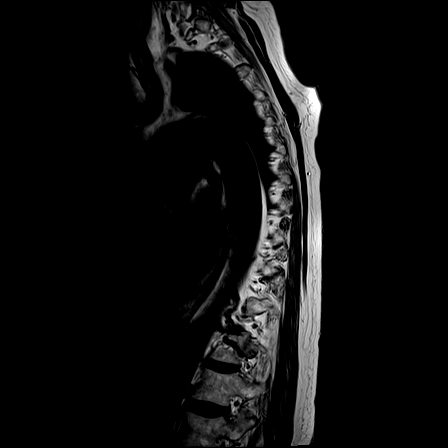
[im 8/15]
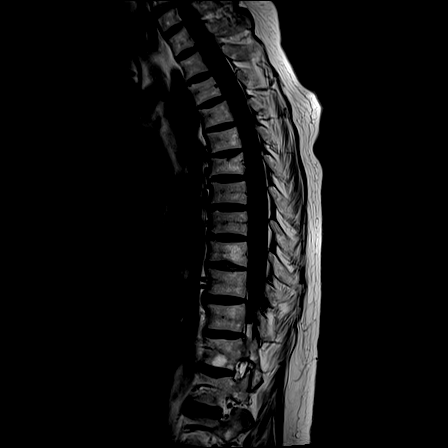
[im 15/15]
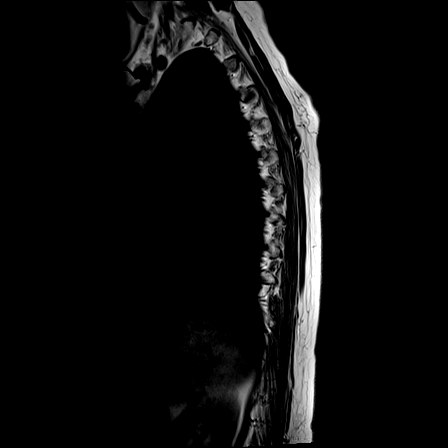

[Series 7: T2 · axial · 4.0mm · 0.39mm/px · z∈[-212,-73]mm · 3 of 39 slices shown (2 of 3)]
[im 6/39]
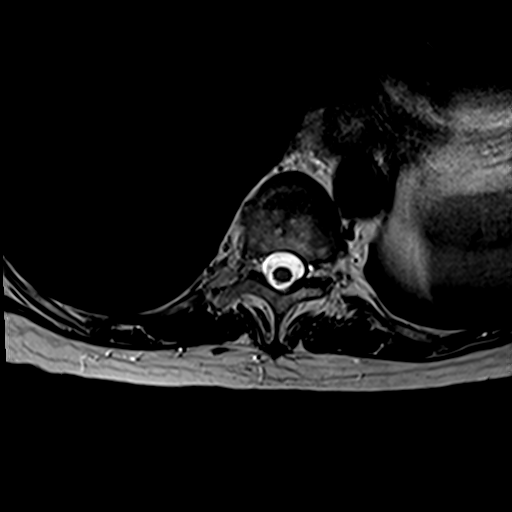
[im 21/39]
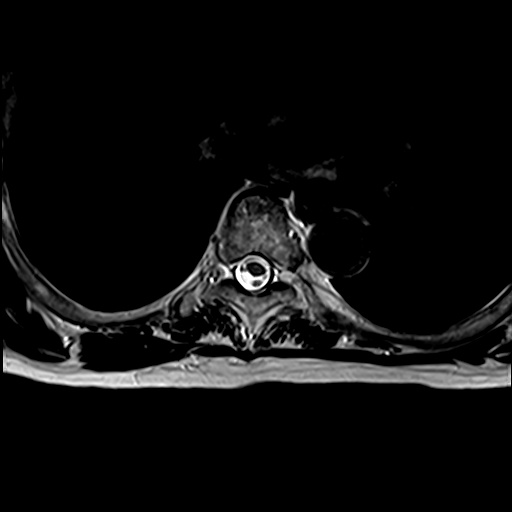
[im 33/39]
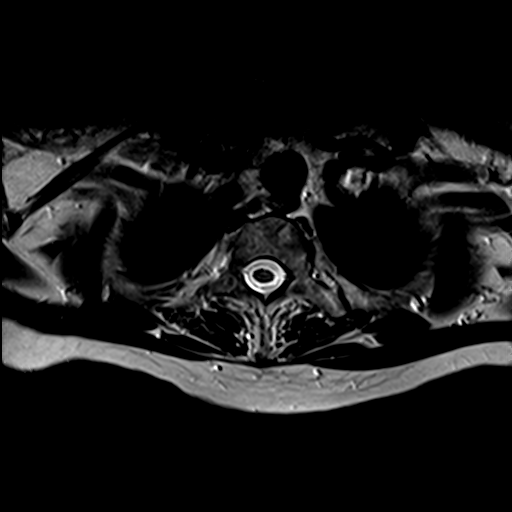

[Series 8: T2 · axial · 4.0mm · 0.39mm/px · z∈[-212,-68]mm · 3 of 39 slices shown (3 of 3)]
[im 6/39]
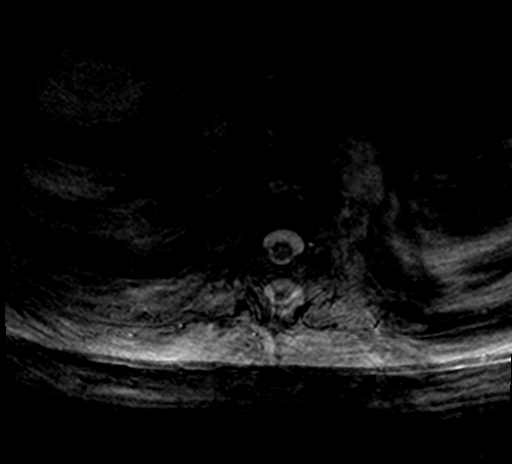
[im 21/39]
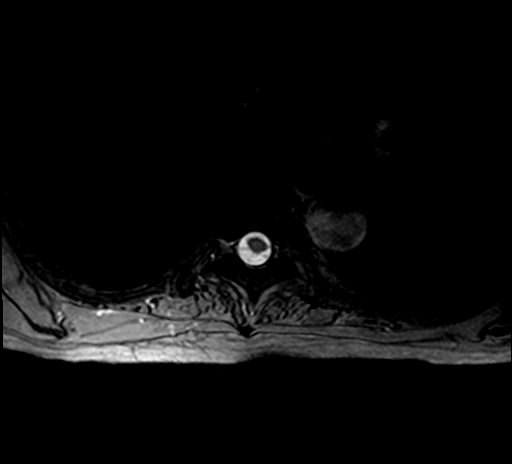
[im 33/39]
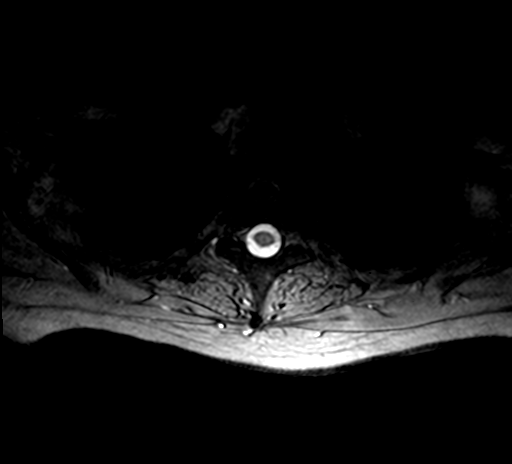

[13 of 48 positions shown; findings below may reference images not displayed]

FINDINGS: Alignment:  Mild dextroscoliosis.  Negative for listhesis.

Vertebrae: No acute fracture, evidence of discitis, or bone lesion.
Remote endplate fractures of T4, T6, T9, T12, L1. There is
associated small intraosseous disc herniations at the T9, T12, and
L1 levels. Depression is at most mild. No retropulsion at any level.

Cord:  Normal signal and morphology.

Paraspinal and other soft tissues: No significant incidental
finding.

Disc levels:

Usual degenerative changes. The discs are diffusely desiccated and
mildly narrowed. No notable facet degeneration. Diffusely patent
foramina.
IMPRESSION: 1. No acute finding or degenerative impingement.
2. Remote and healed endplate fractures listed above.
3. Mild scoliosis.

## 2018-08-22 ENCOUNTER — Other Ambulatory Visit: Payer: Self-pay | Admitting: Cardiovascular Disease

## 2018-08-22 DIAGNOSIS — I480 Paroxysmal atrial fibrillation: Secondary | ICD-10-CM

## 2018-08-25 ENCOUNTER — Ambulatory Visit: Payer: Medicare HMO | Admitting: Cardiovascular Disease

## 2018-08-25 ENCOUNTER — Encounter: Payer: Self-pay | Admitting: Cardiovascular Disease

## 2018-08-25 ENCOUNTER — Encounter

## 2018-08-25 VITALS — BP 161/80 | HR 65 | Ht 61.0 in | Wt 129.0 lb

## 2018-08-25 DIAGNOSIS — M25531 Pain in right wrist: Secondary | ICD-10-CM

## 2018-08-25 DIAGNOSIS — I10 Essential (primary) hypertension: Secondary | ICD-10-CM

## 2018-08-25 DIAGNOSIS — I480 Paroxysmal atrial fibrillation: Secondary | ICD-10-CM | POA: Diagnosis not present

## 2018-08-25 NOTE — Patient Instructions (Addendum)
Medication Instructions:  Continue all current medications.  Labwork:  Flecainide level, CBC, CMET, FLP  Orders given today.   Reminder:  Nothing to eat or drink after 12 midnight prior to labs.  Office will contact with results via phone or letter.    Testing/Procedures:   Follow-Up: Your physician wants you to follow up in:  1 year.  You will receive a reminder letter in the mail one-two months in advance.  If you don't receive a letter, please call our office to schedule the follow up appointment   Any Other Special Instructions Will Be Listed Below (If Applicable). You have been referred to:  [facility]opaedic [facility]ist   If you need a refill on your cardiac medications before your next appointment, please call your pharmacy.

## 2018-08-25 NOTE — Progress Notes (Addendum)
SUBJECTIVE: The patient presents for follow-up of paroxysmal atrial fibrillation and hypertension.  She has a history of noncompliance with treatment regimen.  I prescribed amlodipine for hypertension at a prior visit but she did not take it.  She is getting over a cold which she has had for 3 weeks.  She has some nasal congestion.  She previously had fevers but denies any currently.  She denies chest pain, shortness of breath, leg swelling, and palpitations.  She describes some occasional right wrist pain and weakness with a small growth.  It bothers her when she is painting. She has some mild swelling on the left side of her neck which occurred when she had her most recent infection.  She said she has not had any blood work in 2 years.  She checks her blood pressure at home and she said it is routinely normal.  ECG performed in the office today which I ordered and personally interpreted demonstrates normal sinus rhythm with nonspecific T-wave abnormalities.    Soc: Husband is Twana First who is also my patient.  Review of Systems: As per "subjective", otherwise negative.  Allergies  Allergen Reactions  . Peanut-Containing Drug Products     Lips tingle  . Penicillins   . Asa [Aspirin] Swelling    Throat,eyes  . Bactrim [Sulfamethoxazole-Trimethoprim] Swelling    face  . Biaxin [Clarithromycin] Swelling    face  . Cortisone Swelling    face  . Erythromycin Other (See Comments)    Sores on face  . Tylenol [Acetaminophen] Swelling    face    Current Outpatient Medications  Medication Sig Dispense Refill  . flecainide (TAMBOCOR) 50 MG tablet TAKE ONE TABLET BY MOUTH TWICE DAILY. 180 tablet 0  . meclizine (ANTIVERT) 25 MG tablet Take 25 mg by mouth 3 (three) times daily as needed for dizziness.    . metoprolol succinate (TOPROL-XL) 25 MG 24 hr tablet TAKE ONE TABLET BY MOUTH TWICE DAILY. 180 tablet 0  . vitamin C (ASCORBIC ACID) 500 MG tablet Take 500 mg by mouth daily.      No current facility-administered medications for this visit.     Past Medical History:  Diagnosis Date  . Arrhythmia    afib  . Atrial fibrillation (HCC)   . Meniere disease   . Raynaud disease   . Ulcerative colitis [facility])     Past Surgical History:  Procedure Laterality Date  . TONSILLECTOMY AND ADENOIDECTOMY      Social History   Socioeconomic History  . Marital status: Married    Spouse name: Not on file  . Number of children: 4  . Years of education: Not on file  . Highest education level: Not on file  Occupational History  . Not on file  Social Needs  . Financial resource strain: Not on file  . Food insecurity:    Worry: Not on file    Inability: Not on file  . Transportation needs:    Medical: Not on file    Non-medical: Not on file  Tobacco Use  . Smoking status: Former Smoker    Packs/day: 1.00    Years: 10.00    Pack years: 10.00    Types: Cigarettes    Start date: 06/30/1959    Last attempt to quit: 06/29/1969    Years since quitting: 49.1  . Smokeless tobacco: Never Used  Substance and Sexual Activity  . Alcohol use: No    Alcohol/week: 0.0 standard drinks  Frequency: Never  . Drug use: No  . Sexual activity: Not on file  Lifestyle  . Physical activity:    Days per week: Not on file    Minutes per session: Not on file  . Stress: Not on file  Relationships  . Social connections:    Talks on phone: Not on file    Gets together: Not on file    Attends religious service: Not on file    Active member of club or organization: Not on file    Attends meetings of clubs or organizations: Not on file    Relationship status: Not on file  . Intimate partner violence:    Fear of current or ex partner: Not on file    Emotionally abused: Not on file    Physically abused: Not on file    Forced sexual activity: Not on file  Other Topics Concern  . Not on file  Social History Narrative   Lives with husband and oldest son.           Vitals:    08/25/18 1452  BP: (!) 161/80  Pulse: 65  SpO2: 97%  Weight: 129 lb (58.5 kg)  Height: 5\' 1"  (1.549 m)    Wt Readings from Last 3 Encounters:  08/25/18 129 lb (58.5 kg)  05/17/17 123 lb (55.8 kg)  03/05/17 124 lb (56.2 kg)     PHYSICAL EXAM General: NAD HEENT: Normal. Neck: No JVD, no thyromegaly, mildly enlarged lymph node on left side of neck, nontender. Lungs: Clear to auscultation bilaterally with normal respiratory effort. CV: Regular rate and rhythm, normal S1/S2, no S3/S4, no murmur. No pretibial or periankle edema.  No carotid bruit.   Abdomen: Soft, nontender, no distention.  Neurologic: Alert and oriented.  Psych: Normal affect. Skin: Normal. Musculoskeletal: Mild swelling on volar aspect of right wrist.    ECG: Reviewed above under Subjective   Labs: Lab Results  Component Value Date/Time   BUN 19 07/23/2016   CREATININE 0.7 07/23/2016     Lipids: No results found for: LDLCALC, LDLDIRECT, CHOL, TRIG, HDL     ASSESSMENT AND PLAN: 1.  Paroxysmal atrial fibrillation: Symptomatically stable. Reportedly occurs whenever she has an upper respiratory tract infection or is sleep deprived.  She has previously refused anticoagulation. Continue flecainide and metoprolol.  I will check a flecainide level.  I will also check a comprehensive metabolic panel, CBC, and lipids.  2. Hypertension: Remains elevated. I have twice prescribed amlodipine but she is not taking it.  She says that she checks her blood pressure at home and it is routinely normal.  She may have whitecoat hypertension.  3.  Right wrist pain/weakness: She has a small nontender mass on the volar aspect of right wrist.  She may need some x-rays.  I will make an orthopedic surgery referral.   Disposition: Follow up 1 year   Prentice Docker, M.D., F.A.C.C.

## 2018-08-25 NOTE — Addendum Note (Signed)
Addended by: Lesle Chris on: 08/25/2018 03:32 PM   Modules accepted: Orders

## 2018-09-15 ENCOUNTER — Ambulatory Visit (INDEPENDENT_AMBULATORY_CARE_PROVIDER_SITE_OTHER): Payer: Medicare HMO | Admitting: Orthopaedic Surgery

## 2018-11-16 ENCOUNTER — Other Ambulatory Visit: Payer: Self-pay | Admitting: Cardiovascular Disease

## 2018-11-16 DIAGNOSIS — I480 Paroxysmal atrial fibrillation: Secondary | ICD-10-CM

## 2018-12-01 ENCOUNTER — Ambulatory Visit: Payer: Self-pay | Admitting: Orthopaedic Surgery

## 2019-03-02 ENCOUNTER — Ambulatory Visit: Payer: Medicare HMO | Admitting: Orthopaedic Surgery

## 2019-06-05 ENCOUNTER — Telehealth: Payer: Self-pay | Admitting: Cardiovascular Disease

## 2019-06-05 NOTE — Telephone Encounter (Signed)
Virtual Visit Pre-Appointment Phone Call  "(Name), I am calling you today to discuss your upcoming appointment. We are currently trying to limit exposure to the virus that causes COVID-19 by seeing patients at home rather than in the office."  1. "What is the BEST phone number to call the day of the visit?" - include this in appointment notes  2. Do you have or have access to (through a family member/friend) a smartphone with video capability that we can use for your visit?" a. If yes - list this number in appt notes as cell (if different from BEST phone #) and list the appointment type as a VIDEO visit in appointment notes b. If no - list the appointment type as a PHONE visit in appointment notes  3. Confirm consent - "In the setting of the current Covid19 crisis, you are scheduled for a (phone or video) visit with your provider on (date) at (time).  Just as we do with many in-office visits, in order for you to participate in this visit, we must obtain consent.  If you'd like, I can send this to your mychart (if signed up) or email for you to review.  Otherwise, I can obtain your verbal consent now.  All virtual visits are billed to your insurance company just like a normal visit would be.  By agreeing to a virtual visit, we'd like you to understand that the technology does not allow for your provider to perform an examination, and thus may limit your provider's ability to fully assess your condition. If your provider identifies any concerns that need to be evaluated in person, we will make arrangements to do so.  Finally, though the technology is pretty good, we cannot assure that it will always work on either your or our end, and in the setting of a video visit, we may have to convert it to a phone-only visit.  In either situation, we cannot ensure that we have a secure connection.  Are you willing to proceed?" STAFF: Did the patient verbally acknowledge consent to telehealth visit? Document  YES/NO here: yes  4. Advise patient to be prepared - "Two hours prior to your appointment, go ahead and check your blood pressure, pulse, oxygen saturation, and your weight (if you have the equipment to check those) and write them all down. When your visit starts, your provider will ask you for this information. If you have an Apple Watch or Kardia device, please plan to have heart rate information ready on the day of your appointment. Please have a pen and paper handy nearby the day of the visit as well."  5. Give patient instructions for MyChart download to smartphone OR Doximity/Doxy.me as below if video visit (depending on what platform provider is using)  6. Inform patient they will receive a phone call 15 minutes prior to their appointment time (may be from unknown caller ID) so they should be prepared to answer    TELEPHONE CALL NOTE  Cassidy Miller has been deemed a candidate for a follow-up tele-health visit to limit community exposure during the Covid-19 pandemic. I spoke with the patient via phone to ensure availability of phone/video source, confirm preferred email & phone number, and discuss instructions and expectations.  I reminded Cassidy Miller to be prepared with any vital sign and/or heart rhythm information that could potentially be obtained via home monitoring, at the time of her visit. I reminded Cassidy Miller to expect a phone call prior to  her visit.  Cassidy Miller 06/05/2019 8:36 AM   INSTRUCTIONS FOR DOWNLOADING THE MYCHART APP TO SMARTPHONE  - The patient must first make sure to have activated MyChart and know their login information - If Apple, go to [employer] and type in MyChart in the search bar and download the app. If Android, ask patient to go to [employer] and type in [place] in the search bar and download the app. The app is free but as with any other app downloads, their phone may require them to verify saved payment information or Apple/Android password.    - The patient will need to then log into the app with their MyChart username and password, and select [facility] as their healthcare provider to link the account. When it is time for your visit, go to the MyChart app, find appointments, and click Begin Video Visit. Be sure to Select Allow for your device to access the Microphone and Camera for your visit. You will then be connected, and your provider will be with you shortly.  **If they have any issues connecting, or need assistance please contact MyChart service desk [phone])**  **If using a computer, in order to ensure the best quality for their visit they will need to use either of the following Internet Browsers: [employer], or Google Chrome**  IF USING DOXIMITY or DOXY.ME - The patient will receive a link just prior to their visit by text.     FULL LENGTH CONSENT FOR TELE-HEALTH VISIT   I hereby voluntarily request, consent and authorize [facility] and its employed or contracted physicians, physician assistants, nurse practitioners or other licensed health care professionals (the Practitioner), to provide me with telemedicine health care services (the Services") as deemed necessary by the treating Practitioner. I acknowledge and consent to receive the Services by the Practitioner via telemedicine. I understand that the telemedicine visit will involve communicating with the Practitioner through live audiovisual communication technology and the disclosure of certain medical information by electronic transmission. I acknowledge that I have been given the opportunity to request an in-person assessment or other available alternative prior to the telemedicine visit and am voluntarily participating in the telemedicine visit.  I understand that I have the right to withhold or withdraw my consent to the use of telemedicine in the course of my care at any time, without affecting my right to future care or treatment, and that  the Practitioner or I may terminate the telemedicine visit at any time. I understand that I have the right to inspect all information obtained and/or recorded in the course of the telemedicine visit and may receive copies of available information for a reasonable fee.  I understand that some of the potential risks of receiving the Services via telemedicine include:   Delay or interruption in medical evaluation due to technological equipment failure or disruption;  Information transmitted may not be sufficient (e.g. poor resolution of images) to allow for appropriate medical decision making by the Practitioner; and/or   In rare instances, security protocols could fail, causing a breach of personal health information.  Furthermore, I acknowledge that it is my responsibility to provide information about my medical history, conditions and care that is complete and accurate to the best of my ability. I acknowledge that Practitioner's advice, recommendations, and/or decision may be based on factors not within their control, such as incomplete or inaccurate data provided by me or distortions of diagnostic images or specimens that may result from electronic transmissions.  I understand that the practice of medicine is not an exact science and that Practitioner makes no warranties or guarantees regarding treatment outcomes. I acknowledge that I will receive a copy of this consent concurrently upon execution via email to the email address I last provided but may also request a printed copy by calling the office of [facility].    I understand that my insurance will be billed for this visit.   I have read or had this consent read to me.  I understand the contents of this consent, which adequately explains the benefits and risks of the Services being provided via telemedicine.   I have been provided ample opportunity to ask questions regarding this consent and the Services and have had my questions answered to  my satisfaction.  I give my informed consent for the services to be provided through the use of telemedicine in my medical care  By participating in this telemedicine visit I agree to the above.

## 2019-08-10 ENCOUNTER — Other Ambulatory Visit: Payer: Self-pay | Admitting: Cardiovascular Disease

## 2019-08-10 DIAGNOSIS — I480 Paroxysmal atrial fibrillation: Secondary | ICD-10-CM

## 2019-08-28 ENCOUNTER — Encounter: Payer: Self-pay | Admitting: Cardiovascular Disease

## 2019-08-28 ENCOUNTER — Telehealth (INDEPENDENT_AMBULATORY_CARE_PROVIDER_SITE_OTHER): Payer: Medicare HMO | Admitting: Cardiovascular Disease

## 2019-08-28 VITALS — BP 123/66 | Ht 61.0 in | Wt 123.0 lb

## 2019-08-28 DIAGNOSIS — I480 Paroxysmal atrial fibrillation: Secondary | ICD-10-CM | POA: Diagnosis not present

## 2019-08-28 DIAGNOSIS — I10 Essential (primary) hypertension: Secondary | ICD-10-CM

## 2019-08-28 NOTE — Progress Notes (Signed)
Virtual Visit via Telephone Note   This visit type was conducted due to national recommendations for restrictions regarding the COVID-19 Pandemic (e.g. social distancing) in an effort to limit this patient's exposure and mitigate transmission in our community.  Due to her co-morbid illnesses, this patient is at least at moderate risk for complications without adequate follow up.  This format is felt to be most appropriate for this patient at this time.  The patient did not have access to video technology/had technical difficulties with video requiring transitioning to audio format only (telephone).  All issues noted in this document were discussed and addressed.  No physical exam could be performed with this format.  Please refer to the patient's chart for her  consent to telehealth for [facility].   Date:  08/28/2019   ID:  Cassidy Miller, DOB [birth year], MRN [id number]  Patient Location: Home Provider Location: Office  PCP:  Lawerance Sabal, PA  Cardiologist:  Prentice Docker, MD  Electrophysiologist:  None   Evaluation Performed:  Follow-Up Visit  Chief Complaint:  PAF  History of Present Illness:    Cassidy Miller is a 74 y.o. female with paroxysmal atrial fibrillation and hypertension.  Her husband, Twana First, is also my patient.  Patient seldom has palpitations.  She denies chest pain, leg swelling, orthopnea, and exertional dyspnea.  Her husband's chest pain has been getting worse but he refuses to have anything done about it.  She has not had any recent blood work.     Past Medical History:  Diagnosis Date  . Arrhythmia    afib  . Atrial fibrillation (HCC)   . Meniere disease   . Raynaud disease   . Ulcerative colitis [facility])    Past Surgical History:  Procedure Laterality Date  . TONSILLECTOMY AND ADENOIDECTOMY       Current Meds  Medication Sig  . Cholecalciferol (VITAMIN D) 125 MCG (5000 UT) CAPS Take 1 capsule by mouth daily.  . flecainide (TAMBOCOR) 50  MG tablet TAKE ONE TABLET BY MOUTH TWICE DAILY.  . meclizine (ANTIVERT) 25 MG tablet Take 25 mg by mouth 3 (three) times daily as needed for dizziness.  . metoprolol succinate (TOPROL-XL) 25 MG 24 hr tablet TAKE ONE TABLET BY MOUTH TWICE DAILY.  . vitamin C (ASCORBIC ACID) 500 MG tablet Take 500 mg by mouth daily.  Marland Kitchen zinc gluconate 50 MG tablet Take 50 mg by mouth daily.  . [DISCONTINUED] calcium-vitamin D (OSCAL WITH D) 250-125 MG-UNIT tablet Take 1 tablet by mouth daily.     Allergies:   Peanut-containing drug products, Penicillins, Asa [aspirin], Bactrim [sulfamethoxazole-trimethoprim], Biaxin [clarithromycin], Cortisone, Erythromycin, and Tylenol [acetaminophen]   Social History   Tobacco Use  . Smoking status: Former Smoker    Packs/day: 1.00    Years: 10.00    Pack years: 10.00    Types: Cigarettes    Start date: 06/30/1959    Quit date: 06/29/1969    Years since quitting: 50.1  . Smokeless tobacco: Never Used  Substance Use Topics  . Alcohol use: No    Alcohol/week: 0.0 standard drinks  . Drug use: No     Family Hx: The patient's family history includes Aortic aneurysm in her maternal aunt and mother; Coronary artery disease (age of onset: 42) in her paternal grandmother; Heart attack in her paternal grandmother; Lung disease in her father.  ROS:   Please see the history of present illness.     All other systems reviewed and are  negative.   Prior CV studies:   The following studies were reviewed today:  NA  Labs/Other Tests and Data Reviewed:    EKG:  No ECG reviewed.  Recent Labs: No results found for requested labs within last 8760 hours.   Recent Lipid Panel No results found for: CHOL, TRIG, HDL, CHOLHDL, LDLCALC, LDLDIRECT  Wt Readings from Last 3 Encounters:  08/28/19 123 lb (55.8 kg)  08/25/18 129 lb (58.5 kg)  05/17/17 123 lb (55.8 kg)     Objective:    Vital Signs:  BP 123/66   Ht 5\' 1"  (1.549 m)   Wt 123 lb (55.8 kg)   BMI 23.24 kg/m     VITAL SIGNS:  reviewed  ASSESSMENT & PLAN:    1.  Paroxysmal atrial fibrillation: Symptomatically stable. Reportedly occurs whenever she has an upper respiratory tract infection or is sleep deprived.  She has previously refused anticoagulation. Continue flecainide and metoprolol succinate.  I will check a flecainide level.    She has deferred checking a flecainide level.  2. Hypertension: Blood pressure is normal.  No changes to therapy.    COVID-19 Education: The signs and symptoms of COVID-19 were discussed with the patient and how to seek care for testing (follow up with PCP or arrange E-visit).  The importance of social distancing was discussed today.  Time:   Today, I have spent 10 minutes with the patient with telehealth technology discussing the above problems.     Medication Adjustments/Labs and Tests Ordered: Current medicines are reviewed at length with the patient today.  Concerns regarding medicines are outlined above.   Tests Ordered: No orders of the defined types were placed in this encounter.   Medication Changes: No orders of the defined types were placed in this encounter.   Follow Up:  Virtual Visit  in 1 year(s)  Signed, Kate Sable, MD  08/28/2019 9:58 AM    [place]

## 2019-08-28 NOTE — Patient Instructions (Signed)

## 2019-11-10 ENCOUNTER — Other Ambulatory Visit: Payer: Self-pay | Admitting: Cardiovascular Disease

## 2019-11-10 DIAGNOSIS — I480 Paroxysmal atrial fibrillation: Secondary | ICD-10-CM

## 2019-11-10 MED ORDER — FLECAINIDE ACETATE 50 MG PO TABS: 50.0000 mg | ORAL_TABLET | Freq: Two times a day (BID) | ORAL | 1 refills | Status: DC

## 2019-11-10 MED ORDER — METOPROLOL SUCCINATE ER 25 MG PO TB24: 25.0000 mg | ORAL_TABLET | Freq: Two times a day (BID) | ORAL | 1 refills | Status: DC

## 2019-11-10 NOTE — Telephone Encounter (Signed)
Medication sent to pharmacy  

## 2019-11-10 NOTE — Telephone Encounter (Signed)
Needs her cardiac medication refills sent to [facility] in [place], [place].  She is due now and switching to [facility]

## 2020-02-05 ENCOUNTER — Other Ambulatory Visit: Payer: Self-pay | Admitting: *Deleted

## 2020-02-05 DIAGNOSIS — I480 Paroxysmal atrial fibrillation: Secondary | ICD-10-CM

## 2020-02-05 MED ORDER — METOPROLOL SUCCINATE ER 25 MG PO TB24: 25.0000 mg | ORAL_TABLET | Freq: Two times a day (BID) | ORAL | 1 refills | Status: DC

## 2020-02-05 MED ORDER — FLECAINIDE ACETATE 50 MG PO TABS: 50.0000 mg | ORAL_TABLET | Freq: Two times a day (BID) | ORAL | 1 refills | Status: DC

## 2020-02-26 DIAGNOSIS — H2513 Age-related nuclear cataract, bilateral: Secondary | ICD-10-CM | POA: Diagnosis not present

## 2020-02-26 DIAGNOSIS — H353211 Exudative age-related macular degeneration, right eye, with active choroidal neovascularization: Secondary | ICD-10-CM | POA: Diagnosis not present

## 2020-02-26 DIAGNOSIS — H353121 Nonexudative age-related macular degeneration, left eye, early dry stage: Secondary | ICD-10-CM | POA: Diagnosis not present

## 2020-03-05 ENCOUNTER — Encounter (INDEPENDENT_AMBULATORY_CARE_PROVIDER_SITE_OTHER): Payer: Medicare HMO | Admitting: Ophthalmology

## 2020-03-07 NOTE — Progress Notes (Signed)
[facility] - Clinic Note  03/12/2020     CHIEF COMPLAINT Patient presents for Retina Evaluation   HISTORY OF PRESENT ILLNESS: Cassidy Miller is a 74 y.o. female who presents to the clinic today for:   HPI    Retina Evaluation    In right eye.  This started 2 weeks ago.  Duration of 2 weeks.  Associated Symptoms Flashes, Floaters and Distortion.  Context:  distance vision, mid-range vision and near vision.  Treatments tried include no treatments.  I, the attending physician,  performed the HPI with the patient and updated documentation appropriately.          Comments    74 y/o female pt referred by Dr. Alben SpittleWeaver for eval of parapapillary CNVM OD.  Pt saw Dr. Alben SpittleWeaver 2 wks ago after experiencing sudden onset flashes OD, followed by a lingering round "spot" in her central vision OD.  Spot may have gotten a bit smaller over the past 2 wks.  Pt still sees occasional brief FOL OD.  VA blurred OU (worse OD), but has been for some time.  No gtts.       Last edited by Rennis ChrisZamora, Bebe Moncure, MD on 03/12/2020  9:39 AM. (History)    pt is here on the referral of Dr. Alben SpittleWeaver for concern of peripapillary CNV, she states Dr. Alben SpittleWeaver told her she has a "leakage" in her right eye, pt states she sees a "black hole" in her vision, she feels like it has gotten smaller since she first noticed it  Referring physician: Dimitri PedWeaver, Christopher D, MD [address] [place] [id number]  HISTORICAL INFORMATION:   Selected notes from the MEDICAL RECORD NUMBER Referred by Dr. Alben SpittleWeaver for eval of parapapillary CNVM OD    CURRENT MEDICATIONS: No current outpatient medications on file. (Ophthalmic Drugs)   No current facility-administered medications for this visit. (Ophthalmic Drugs)   Current Outpatient Medications (Other)  Medication Sig  . Cholecalciferol (VITAMIN D) 125 MCG (5000 UT) CAPS Take 1 capsule by mouth daily.  . flecainide (TAMBOCOR) 50 MG tablet Take 1 tablet (50 mg total) by  mouth 2 (two) times daily.  . meclizine (ANTIVERT) 25 MG tablet Take 25 mg by mouth 3 (three) times daily as needed for dizziness.  . metoprolol succinate (TOPROL-XL) 25 MG 24 hr tablet Take 1 tablet (25 mg total) by mouth 2 (two) times daily.  . promethazine (PHENERGAN) 12.5 MG tablet   . vitamin C (ASCORBIC ACID) 500 MG tablet Take 500 mg by mouth daily.  Marland Kitchen. zinc gluconate 50 MG tablet Take 50 mg by mouth daily.   No current facility-administered medications for this visit. (Other)      REVIEW OF SYSTEMS: ROS    Positive for: Musculoskeletal, Cardiovascular, Eyes   Negative for: Constitutional, Gastrointestinal, Neurological, Skin, Genitourinary, HENT, Endocrine, Respiratory, Psychiatric, Allergic/Imm, Heme/Lymph   Last edited by Celine MansBaxley, Andrew G, COA on 03/12/2020  8:58 AM. (History)       ALLERGIES Allergies  Allergen Reactions  . Peanut-Containing Drug Products     Lips tingle  . Penicillins   . Asa [Aspirin] Swelling    Throat,eyes  . Bactrim [Sulfamethoxazole-Trimethoprim] Swelling    face  . Biaxin [Clarithromycin] Swelling    face  . Cortisone Swelling    face  . Erythromycin Other (See Comments)    Sores on face  . Tylenol [Acetaminophen] Swelling    face    PAST MEDICAL HISTORY Past Medical History:  Diagnosis Date  . Arrhythmia    afib  . Atrial fibrillation (HCC)   . Cataract    Mixed form OU  . Macular degeneration    OU  . Meniere disease   . Raynaud disease   . Ulcerative colitis [facility])    Past Surgical History:  Procedure Laterality Date  . TONSILLECTOMY AND ADENOIDECTOMY      FAMILY HISTORY Family History  Problem Relation Age of Onset  . Aortic aneurysm Mother   . Lung disease Father   . Aortic aneurysm Maternal Aunt   . Coronary artery disease Paternal Grandmother 62       died  . Heart attack Paternal Grandmother     SOCIAL HISTORY Social History   Tobacco Use  . Smoking status: Former Smoker    Packs/day: 1.00    Years:  10.00    Pack years: 10.00    Types: Cigarettes    Start date: 06/30/1959    Quit date: 06/29/1969    Years since quitting: 50.7  . Smokeless tobacco: Never Used  Vaping Use  . Vaping Use: Never used  Substance Use Topics  . Alcohol use: No    Alcohol/week: 0.0 standard drinks  . Drug use: No         OPHTHALMIC EXAM:  Base Eye Exam    Visual Acuity (Snellen - Linear)      Right Left   Dist [place] 20/60 -2 20/70 -2   Dist ph [place] 20/30 -2 20/25 -2       Tonometry (Tonopen, 9:01 AM)      Right Left   Pressure 13   Pt defers IOP ck OS       Pupils      Dark Light Shape React APD   Right 3 2 Round Brisk None   Left 3 2 Round Brisk None       Visual Fields (Counting fingers)      Left Right    Full Full       Extraocular Movement      Right Left    Full, Ortho Full, Ortho       Neuro/Psych    Oriented x3: Yes   Mood/Affect: Normal       Dilation    Right eye: 1.0% Mydriacyl, 2.5% Phenylephrine @ 9:01 AM  Pt defers dilation OS.  States that having both eyes dilated makes her extremely nauseas and triggers excessive vertigo.        Slit Lamp and Fundus Exam    Slit Lamp Exam      Right Left   Lids/Lashes Dermatochalasis - upper lid Dermatochalasis - upper lid   Conjunctiva/Sclera White and quiet White and quiet   Cornea Mild arcus, Debris in tear film Mild arcus, Debris in tear film   Anterior Chamber deep, narrow temporal angle deep, narrow temporal angle   Iris Round and dilated Round and reactive -- not dilated   Lens 3+ Nuclear sclerosis with early brunescence, 2-3+ Cortical cataract 2+ Nuclear sclerosis, 2+ Cortical cataract   Vitreous Vitreous syneresis Poor view due to no dilation       Fundus Exam      Right Left   Disc Compact, tilted, Pink and Sharp, peripapillary CNV with SRH from 0900-1200 no view   C/D Ratio 0.3    Macula Blunted foveal reflex, central Drusen, RPE mottling and clumping, SRH SN macula no view   Vessels Vascular attenuation,  mild tortuousity no view   Periphery Attached,  No RT/RD no view        Refraction    Manifest Refraction      Sphere Cylinder Dist VA   Right -2.00 Sphere 20/30+2   Left -0.75 Sphere 20/50          IMAGING AND PROCEDURES  Imaging and Procedures for 03/12/2020  OCT, Retina - OU - Both Eyes       Right Eye Quality was good. Central Foveal Thickness: 274. Progression has no prior data. Findings include normal foveal contour, no IRF, pigment epithelial detachment, retinal drusen , subretinal fluid, subretinal hyper-reflective material (peripapillary CNV with SRF/SRHM ST disc).   Left Eye Quality was good. Central Foveal Thickness: 280. Progression has no prior data. Findings include normal foveal contour, no IRF, no SRF, retinal drusen , pigment epithelial detachment.   Notes *Images captured and stored on drive  Diagnosis / Impression:  OD: exu ARMD with peripapillary CNV and SRHM/SRF OS: Non-exu ARMD  Clinical management:  See below  Abbreviations: NFP - Normal foveal profile. CME - cystoid macular edema. PED - pigment epithelial detachment. IRF - intraretinal fluid. SRF - subretinal fluid. EZ - ellipsoid zone. ERM - epiretinal membrane. ORA - outer retinal atrophy. ORT - outer retinal tubulation. SRHM - subretinal hyper-reflective material. IRHM - intraretinal hyper-reflective material        Intravitreal Injection, Pharmacologic Agent - OD - Right Eye       Time Out 03/12/2020. 10:21 AM. Confirmed correct patient, procedure, site, and patient consented.   Anesthesia Topical anesthesia was used. Anesthetic medications included Lidocaine 2%, Proparacaine 0.5%.   Procedure Preparation included 5% betadine to ocular surface, eyelid speculum. A supplied needle was used.   Injection:  1.25 mg Bevacizumab (AVASTIN) SOLN   NDC: 29528-413-24, Lot: [id number] , Expiration date: 04/14/2020   Route: Intravitreal, Site: Right Eye, Waste: 0 mL  Post-op Post injection  exam found visual acuity of at least counting fingers. The patient tolerated the procedure well. There were no complications. The patient received written and verbal post procedure care education. Post injection medications were not given.                 ASSESSMENT/PLAN:    ICD-10-CM   1. Exudative age-related macular degeneration of right eye with active choroidal neovascularization (HCC)  H35.3211 Intravitreal Injection, Pharmacologic Agent - OD - Right Eye    Bevacizumab (AVASTIN) SOLN 1.25 mg  2. Retinal edema  H35.81 OCT, Retina - OU - Both Eyes  3. Intermediate stage nonexudative age-related macular degeneration of left eye  H35.3122   4. Essential hypertension  I10   5. Hypertensive retinopathy of both eyes  H35.033   6. Combined forms of age-related cataract of both eyes  H25.813     1,2. Exudative age related macular degeneration, right eye   - pt reports brown spot in vision at 5 oclock visual field  - peripapillary CNV off ST disc -- +subretinal heme    - The incidence pathology and anatomy of wet AMD discussed   - The ANCHOR, MARINA, CATT and VIEW trials discussed with patient.    - discussed treatment options including observation vs intravitreal anti-VEGF agents such as Avastin, Lucentis, Eylea.    - Risks of endophthalmitis and vascular occlusive events and atrophic changes discussed with patient  - OCT peripapillary CNV with SRF/SRHM ST disc OD -- no central involvement  - BCVA good at 20/30   - recommend IVA OD #1 today, 09.14.21  - pt wishes to be  treated with IVA  - RBA of procedure discussed, questions answered  - informed consent obtained and signed  - see procedure note  - f/u in 4 wks, DFE, OCT, possible injection  3. Age related macular degeneration, non-exudative, left eye  - The incidence, anatomy, and pathology of dry AMD, risk of progression, and the AREDS and AREDS 2 study including smoking risks discussed with patient.  - Recommend amsler grid  monitoring  4,5. Hypertensive retinopathy OU - discussed importance of tight BP control - monitor  6. Mixed Cataract OU - The symptoms of cataract, surgical options, and treatments and risks were discussed with patient. - discussed diagnosis and progression - not yet visually significant - monitor for now  Ophthalmic Meds Ordered this visit:  Meds ordered this encounter  Medications  . Bevacizumab (AVASTIN) SOLN 1.25 mg      Return in about 4 weeks (around 04/09/2020) for f/u exu ARMD OD, DFE, OCT.  There are no Patient Instructions on file for this visit.   Explained the diagnoses, plan, and follow up with the patient and they expressed understanding.  Patient expressed understanding of the importance of proper follow up care.  This document serves as a record of services personally performed by Karie Chimera, MD, PhD. It was created on their behalf by Cristopher Estimable, COT an ophthalmic technician. The creation of this record is the provider's dictation and/or activities during the visit.    Electronically signed by: Cristopher Estimable, COT 9.9.21 @ 1:06 PM   This document serves as a record of services personally performed by Karie Chimera, MD, PhD. It was created on their behalf by Glee Arvin. Manson Passey, OA an ophthalmic technician. The creation of this record is the provider's dictation and/or activities during the visit.    Electronically signed by: Glee Arvin. Manson Passey, [place] 09.14.2021 1:06 PM   Karie Chimera, M.D., Ph.D. Diseases & Surgery of the Retina and Vitreous [facility]  I have reviewed the above documentation for accuracy and completeness, and I agree with the above. Karie Chimera, M.D., Ph.D. 03/12/20 1:06 PM   Abbreviations: M myopia (nearsighted); A astigmatism; H hyperopia (farsighted); P presbyopia; Mrx spectacle prescription;  CTL contact lenses; OD right eye; OS left eye; OU both eyes  XT exotropia; ET esotropia; PEK punctate epithelial  keratitis; PEE punctate epithelial erosions; DES dry eye syndrome; MGD meibomian gland dysfunction; ATs artificial tears; PFAT's preservative free artificial tears; NSC nuclear sclerotic cataract; PSC posterior subcapsular cataract; ERM epi-retinal membrane; PVD posterior vitreous detachment; RD retinal detachment; DM diabetes mellitus; DR diabetic retinopathy; NPDR non-proliferative diabetic retinopathy; PDR proliferative diabetic retinopathy; CSME clinically significant macular edema; DME diabetic macular edema; dbh dot blot hemorrhages; CWS cotton wool spot; POAG primary open angle glaucoma; C/D cup-to-disc ratio; HVF humphrey visual field; GVF goldmann visual field; OCT optical coherence tomography; IOP intraocular pressure; BRVO Branch retinal vein occlusion; CRVO central retinal vein occlusion; CRAO central retinal artery occlusion; BRAO branch retinal artery occlusion; RT retinal tear; SB scleral buckle; PPV pars plana vitrectomy; VH Vitreous hemorrhage; PRP panretinal laser photocoagulation; IVK intravitreal kenalog; VMT vitreomacular traction; MH Macular hole;  NVD neovascularization of the disc; NVE neovascularization elsewhere; AREDS age related eye disease study; ARMD age related macular degeneration; POAG primary open angle glaucoma; EBMD epithelial/anterior basement membrane dystrophy; ACIOL anterior chamber intraocular lens; IOL intraocular lens; PCIOL posterior chamber intraocular lens; Phaco/IOL phacoemulsification with intraocular lens placement; PRK photorefractive keratectomy; LASIK laser assisted in situ keratomileusis; HTN hypertension;  DM diabetes mellitus; COPD chronic obstructive pulmonary disease

## 2020-03-12 ENCOUNTER — Ambulatory Visit (INDEPENDENT_AMBULATORY_CARE_PROVIDER_SITE_OTHER): Payer: Medicare HMO | Admitting: Ophthalmology

## 2020-03-12 ENCOUNTER — Other Ambulatory Visit: Payer: Self-pay

## 2020-03-12 ENCOUNTER — Encounter (INDEPENDENT_AMBULATORY_CARE_PROVIDER_SITE_OTHER): Payer: Self-pay | Admitting: Ophthalmology

## 2020-03-12 DIAGNOSIS — I10 Essential (primary) hypertension: Secondary | ICD-10-CM | POA: Diagnosis not present

## 2020-03-12 DIAGNOSIS — H353211 Exudative age-related macular degeneration, right eye, with active choroidal neovascularization: Secondary | ICD-10-CM

## 2020-03-12 DIAGNOSIS — H35033 Hypertensive retinopathy, bilateral: Secondary | ICD-10-CM

## 2020-03-12 DIAGNOSIS — H3581 Retinal edema: Secondary | ICD-10-CM

## 2020-03-12 DIAGNOSIS — H25813 Combined forms of age-related cataract, bilateral: Secondary | ICD-10-CM | POA: Diagnosis not present

## 2020-03-12 DIAGNOSIS — H353122 Nonexudative age-related macular degeneration, left eye, intermediate dry stage: Secondary | ICD-10-CM | POA: Diagnosis not present

## 2020-03-12 MED ORDER — BEVACIZUMAB CHEMO INJECTION 1.25MG/0.05ML SYRINGE FOR KALEIDOSCOPE
1.2500 mg | INTRAVITREAL | Status: AC | PRN
  Administered 2020-03-12: 13:00:00 1.25 mg via INTRAVITREAL

## 2020-04-05 NOTE — Progress Notes (Signed)
[facility] - Clinic Note  04/09/2020     CHIEF COMPLAINT Patient presents for Retina Follow Up   HISTORY OF PRESENT ILLNESS: Cassidy Miller is a 74 y.o. female who presents to the clinic today for:  HPI    Retina Follow Up    Patient presents with  Wet AMD.  In right eye.  This started 4 weeks ago.  Severity is moderate.  Duration of 4 weeks.  Since onset it is stable.  I, the attending physician,  performed the HPI with the patient and updated documentation appropriately.          Comments    74 y/o female pt here for 4 wk f/u for exu ARMD OD.  S/p IVA #1 OD 9.14.21.  [place] in [place] O[place].  Denies pain, FOL, floaters.  AT prn OU.       Last edited by Rennis ChrisZamora, Winfred Redel, MD on 04/09/2020  9:27 PM. (History)    Pt states her eye hurt for 2 days after her first injection and she had an ocular migraine the next day, she states the black spot in her vision is fading and turning white  Referring physician: Dimitri PedWeaver, Christopher D, MD [address] [place] [id number]  HISTORICAL INFORMATION:   Selected notes from the MEDICAL RECORD NUMBER Referred by Dr. Alben SpittleWeaver for eval of parapapillary CNVM OD   CURRENT MEDICATIONS: No current outpatient medications on file. (Ophthalmic Drugs)   No current facility-administered medications for this visit. (Ophthalmic Drugs)   Current Outpatient Medications (Other)  Medication Sig  . Cholecalciferol (VITAMIN D) 125 MCG (5000 UT) CAPS Take 1 capsule by mouth daily.  . flecainide (TAMBOCOR) 50 MG tablet Take 1 tablet (50 mg total) by mouth 2 (two) times daily.  . meclizine (ANTIVERT) 25 MG tablet Take 25 mg by mouth 3 (three) times daily as needed for dizziness.  . metoprolol succinate (TOPROL-XL) 25 MG 24 hr tablet Take 1 tablet (25 mg total) by mouth 2 (two) times daily.  . promethazine (PHENERGAN) 12.5 MG tablet   . vitamin C (ASCORBIC ACID) 500 MG tablet Take 500 mg by mouth daily.  Marland Kitchen. zinc gluconate 50 MG tablet  Take 50 mg by mouth daily.   No current facility-administered medications for this visit. (Other)      REVIEW OF SYSTEMS: ROS    Positive for: Musculoskeletal, Cardiovascular, Eyes   Negative for: Constitutional, Gastrointestinal, Neurological, Skin, Genitourinary, HENT, Endocrine, Respiratory, Psychiatric, Allergic/Imm, Heme/Lymph   Last edited by Celine MansBaxley, Andrew G, COA on 04/09/2020  9:05 AM. (History)       ALLERGIES Allergies  Allergen Reactions  . Peanut-Containing Drug Products     Lips tingle  . Penicillins   . Asa [Aspirin] Swelling    Throat,eyes  . Bactrim [Sulfamethoxazole-Trimethoprim] Swelling    face  . Biaxin [Clarithromycin] Swelling    face  . Cortisone Swelling    face  . Erythromycin Other (See Comments)    Sores on face  . Tylenol [Acetaminophen] Swelling    face    PAST MEDICAL HISTORY Past Medical History:  Diagnosis Date  . Arrhythmia    afib  . Atrial fibrillation (HCC)   . Cataract    Mixed form OU  . Hypertensive retinopathy    OU  . Macular degeneration    OU  . Meniere disease   . Raynaud disease   . Ulcerative colitis [facility](HCC)    Past Surgical History:  Procedure  Laterality Date  . TONSILLECTOMY AND ADENOIDECTOMY      FAMILY HISTORY Family History  Problem Relation Age of Onset  . Aortic aneurysm Mother   . Lung disease Father   . Aortic aneurysm Maternal Aunt   . Coronary artery disease Paternal Grandmother 22       died  . Heart attack Paternal Grandmother     SOCIAL HISTORY Social History   Tobacco Use  . Smoking status: Former Smoker    Packs/day: 1.00    Years: 10.00    Pack years: 10.00    Types: Cigarettes    Start date: 06/30/1959    Quit date: 06/29/1969    Years since quitting: 50.8  . Smokeless tobacco: Never Used  Vaping Use  . Vaping Use: Never used  Substance Use Topics  . Alcohol use: No    Alcohol/week: 0.0 standard drinks  . Drug use: No         OPHTHALMIC EXAM:  Base Eye Exam     Visual Acuity (Snellen - Linear)      Right Left   Dist [place] 20/70 -2 20/70 -2   Dist ph [place] 20/30 -2 20/25       Tonometry (Tonopen, 9:07 AM)      Right Left   Pressure 13 Def  Pt defers gtts OS       Pupils      Dark Light Shape React APD   Right 3 2 Round Brisk None   Left 3 2 Round Brisk None       Visual Fields (Counting fingers)      Left Right     Full       Extraocular Movement      Right Left    Full, Ortho Full, Ortho       Neuro/Psych    Oriented x3: Yes   Mood/Affect: Normal       Dilation    Right eye: 1.0% Mydriacyl, 2.5% Phenylephrine @ 9:08 AM  Pt defers dil OS, stating that having both eyes dilated gives her vertigo.        Slit Lamp and Fundus Exam    Slit Lamp Exam      Right Left   Lids/Lashes Dermatochalasis - upper lid Dermatochalasis - upper lid   Conjunctiva/Sclera White and quiet White and quiet   Cornea Mild arcus, Debris in tear film Mild arcus, Debris in tear film   Anterior Chamber deep, narrow temporal angle deep, narrow temporal angle   Iris Round and dilated Round and reactive -- not dilated   Lens 3+ Nuclear sclerosis with early brunescence, 2-3+ Cortical cataract 2+ Nuclear sclerosis, 2+ Cortical cataract   Vitreous Vitreous syneresis Poor view due to no dilation       Fundus Exam      Right Left   Disc Compact, tilted, Pink and Sharp, peripapillary CNV with SRH from 0900-1200 -- slightly improved and fading no view   C/D Ratio 0.3    Macula Blunted foveal reflex, central Drusen, RPE mottling and clumping, SRH SN macula -- improving no view   Vessels Vascular attenuation, mild tortuousity no view   Periphery Attached, No RT/RD no view          IMAGING AND PROCEDURES  Imaging and Procedures for 04/09/2020  OCT, Retina - OU - Both Eyes       Right Eye Quality was good. Central Foveal Thickness: 271. Progression has improved. Findings include normal foveal contour, no IRF, pigment epithelial  detachment, retinal  drusen , subretinal fluid, subretinal hyper-reflective material (Mild interval improvement in peripapillary SRF/SRHM/PED ST disc).   Left Eye Quality was good. Central Foveal Thickness: 279. Progression has been stable. Findings include normal foveal contour, no IRF, no SRF, retinal drusen , pigment epithelial detachment.   Notes *Images captured and stored on drive  Diagnosis / Impression:  OD: exu ARMD with Mild interval improvement in peripapillary SRF/SRHM/PED ST disc OS: Non-exu ARMD  Clinical management:  See below  Abbreviations: NFP - Normal foveal profile. CME - cystoid macular edema. PED - pigment epithelial detachment. IRF - intraretinal fluid. SRF - subretinal fluid. EZ - ellipsoid zone. ERM - epiretinal membrane. ORA - outer retinal atrophy. ORT - outer retinal tubulation. SRHM - subretinal hyper-reflective material. IRHM - intraretinal hyper-reflective material        Intravitreal Injection, Pharmacologic Agent - OD - Right Eye       Time Out 04/09/2020. 10:06 AM. Confirmed correct patient, procedure, site, and patient consented.   Anesthesia Topical anesthesia was used. Anesthetic medications included Lidocaine 2%, Proparacaine 0.5%.   Procedure Preparation included 5% betadine to ocular surface, eyelid speculum. A supplied needle was used.   Injection:  1.25 mg Bevacizumab (AVASTIN) SOLN   NDC: 94765-465-03, Lot: [id number] , Expiration date: 05/16/2020   Route: Intravitreal, Site: Right Eye, Waste: 0 mL  Post-op Post injection exam found visual acuity of at least counting fingers. The patient tolerated the procedure well. There were no complications. The patient received written and verbal post procedure care education. Post injection medications were not given.                 ASSESSMENT/PLAN:    ICD-10-CM   1. Exudative age-related macular degeneration of right eye with active choroidal neovascularization (HCC)  H35.3211 Intravitreal Injection,  Pharmacologic Agent - OD - Right Eye    Bevacizumab (AVASTIN) SOLN 1.25 mg  2. Retinal edema  H35.81 OCT, Retina - OU - Both Eyes  3. Intermediate stage nonexudative age-related macular degeneration of left eye  H35.3122   4. Essential hypertension  I10   5. Hypertensive retinopathy of both eyes  H35.033   6. Combined forms of age-related cataract of both eyes  H25.813     1,2. Exudative age related macular degeneration, right eye   - pt reports brown spot in vision at 5 oclock visual field -- improving  - IVA OD #1 (09.14.21)  - peripapillary CNV off ST disc -- +subretinal heme    - OCT interval improvement in peripapillary SRF/SRHM/PED ST disc OD -- no central involvement  - BCVA good at 20/30              - Recommened IVA OD #2 today, 10.12.21  - pt wishes to be treated with IVA  - RBA of procedure discussed, questions answered  - informed consent obtained and signed  - see procedure note  - f/u in 4 wks, DFE, OCT, possible injection  3. Age related macular degeneration, non-exudative, left eye  - The incidence, anatomy, and pathology of dry AMD, risk of progression, and the AREDS and AREDS 2 study including smoking risks discussed with patient.  - Recommend amsler grid monitoring  4,5. Hypertensive retinopathy OU - discussed importance of tight BP control - monitor  6. Mixed Cataract OU - The symptoms of cataract, surgical options, and treatments and risks were discussed with patient. - discussed diagnosis and progression - not yet visually significant - monitor for now  Ophthalmic Meds Ordered  this visit:  Meds ordered this encounter  Medications  . Bevacizumab (AVASTIN) SOLN 1.25 mg      Return in about 4 weeks (around 05/07/2020) for f/u exu ARMD OD, DFE, OCT.  There are no Patient Instructions on file for this visit.   Explained the diagnoses, plan, and follow up with the patient and they expressed understanding.  Patient expressed understanding of the  importance of proper follow up care.  This document serves as a record of services personally performed by Karie Chimera, MD, PhD. It was created on their behalf by Cristopher Estimable, COT an ophthalmic technician. The creation of this record is the provider's dictation and/or activities during the visit.    Electronically signed by: Cristopher Estimable, COT 10.8.21 @ 9:54 PM   This document serves as a record of services personally performed by Karie Chimera, MD, PhD. It was created on their behalf by Glee Arvin. Manson Passey, OA an ophthalmic technician. The creation of this record is the provider's dictation and/or activities during the visit.    Electronically signed by: Glee Arvin. Manson Passey, [place] 10.12.2021 9:54 PM  Karie Chimera, M.D., Ph.D. Diseases & Surgery of the Retina and Vitreous [facility] 04/09/2020   I have reviewed the above documentation for accuracy and completeness, and I agree with the above. Karie Chimera, M.D., Ph.D. 04/09/20 9:54 PM   Abbreviations: M myopia (nearsighted); A astigmatism; H hyperopia (farsighted); P presbyopia; Mrx spectacle prescription;  CTL contact lenses; OD right eye; OS left eye; OU both eyes  XT exotropia; ET esotropia; PEK punctate epithelial keratitis; PEE punctate epithelial erosions; DES dry eye syndrome; MGD meibomian gland dysfunction; ATs artificial tears; PFAT's preservative free artificial tears; NSC nuclear sclerotic cataract; PSC posterior subcapsular cataract; ERM epi-retinal membrane; PVD posterior vitreous detachment; RD retinal detachment; DM diabetes mellitus; DR diabetic retinopathy; NPDR non-proliferative diabetic retinopathy; PDR proliferative diabetic retinopathy; CSME clinically significant macular edema; DME diabetic macular edema; dbh dot blot hemorrhages; CWS cotton wool spot; POAG primary open angle glaucoma; C/D cup-to-disc ratio; HVF humphrey visual field; GVF goldmann visual field; OCT optical coherence tomography; IOP  intraocular pressure; BRVO Branch retinal vein occlusion; CRVO central retinal vein occlusion; CRAO central retinal artery occlusion; BRAO branch retinal artery occlusion; RT retinal tear; SB scleral buckle; PPV pars plana vitrectomy; VH Vitreous hemorrhage; PRP panretinal laser photocoagulation; IVK intravitreal kenalog; VMT vitreomacular traction; MH Macular hole;  NVD neovascularization of the disc; NVE neovascularization elsewhere; AREDS age related eye disease study; ARMD age related macular degeneration; POAG primary open angle glaucoma; EBMD epithelial/anterior basement membrane dystrophy; ACIOL anterior chamber intraocular lens; IOL intraocular lens; PCIOL posterior chamber intraocular lens; Phaco/IOL phacoemulsification with intraocular lens placement; PRK photorefractive keratectomy; LASIK laser assisted in situ keratomileusis; HTN hypertension; DM diabetes mellitus; COPD chronic obstructive pulmonary disease

## 2020-04-09 ENCOUNTER — Ambulatory Visit (INDEPENDENT_AMBULATORY_CARE_PROVIDER_SITE_OTHER): Payer: Medicare HMO | Admitting: Ophthalmology

## 2020-04-09 ENCOUNTER — Encounter (INDEPENDENT_AMBULATORY_CARE_PROVIDER_SITE_OTHER): Payer: Self-pay | Admitting: Ophthalmology

## 2020-04-09 ENCOUNTER — Other Ambulatory Visit: Payer: Self-pay

## 2020-04-09 DIAGNOSIS — I10 Essential (primary) hypertension: Secondary | ICD-10-CM

## 2020-04-09 DIAGNOSIS — H353211 Exudative age-related macular degeneration, right eye, with active choroidal neovascularization: Secondary | ICD-10-CM

## 2020-04-09 DIAGNOSIS — H25813 Combined forms of age-related cataract, bilateral: Secondary | ICD-10-CM | POA: Diagnosis not present

## 2020-04-09 DIAGNOSIS — H35033 Hypertensive retinopathy, bilateral: Secondary | ICD-10-CM | POA: Diagnosis not present

## 2020-04-09 DIAGNOSIS — H3581 Retinal edema: Secondary | ICD-10-CM

## 2020-04-09 DIAGNOSIS — H353122 Nonexudative age-related macular degeneration, left eye, intermediate dry stage: Secondary | ICD-10-CM

## 2020-04-09 MED ORDER — BEVACIZUMAB CHEMO INJECTION 1.25MG/0.05ML SYRINGE FOR KALEIDOSCOPE
1.2500 mg | INTRAVITREAL | Status: AC | PRN
  Administered 2020-04-09: 1.25 mg via INTRAVITREAL

## 2020-05-02 NOTE — Progress Notes (Signed)
[facility] - Clinic Note  05/07/2020     CHIEF COMPLAINT Patient presents for Retina Follow Up   HISTORY OF PRESENT ILLNESS: Cassidy Miller is a 74 y.o. female who presents to the clinic today for:  HPI    Retina Follow Up    Patient presents with  Wet AMD.  In right eye.  This started weeks ago.  Severity is moderate.  Duration of weeks.  Since onset it is stable.  I, the attending physician,  performed the HPI with the patient and updated documentation appropriately.          Comments    Pt states her vision is the same OU.  Pt complains of new "wavy lines" OD, following most recent IVA.  Patient denies any new or worsening floaters or fol OU.       Last edited by Rennis ChrisZamora, Shastina Rua, MD on 05/07/2020  9:11 PM. (History)    pt states she can't tell a difference in her vision, but the black spot seems to be lighter  Referring physician: Lawerance SabalWorley, Miranda, PA [address] [place],  [place] [id number]  HISTORICAL INFORMATION:   Selected notes from the MEDICAL RECORD NUMBER Referred by Dr. Alben SpittleWeaver for eval of parapapillary CNVM OD   CURRENT MEDICATIONS: No current outpatient medications on file. (Ophthalmic Drugs)   No current facility-administered medications for this visit. (Ophthalmic Drugs)   Current Outpatient Medications (Other)  Medication Sig   Cholecalciferol (VITAMIN D) 125 MCG (5000 UT) CAPS Take 1 capsule by mouth daily.   flecainide (TAMBOCOR) 50 MG tablet Take 1 tablet (50 mg total) by mouth 2 (two) times daily.   meclizine (ANTIVERT) 25 MG tablet Take 25 mg by mouth 3 (three) times daily as needed for dizziness.   metoprolol succinate (TOPROL-XL) 25 MG 24 hr tablet Take 1 tablet (25 mg total) by mouth 2 (two) times daily.   promethazine (PHENERGAN) 12.5 MG tablet    vitamin C (ASCORBIC ACID) 500 MG tablet Take 500 mg by mouth daily.   zinc gluconate 50 MG tablet Take 50 mg by mouth daily.   No current facility-administered medications for this  visit. (Other)      REVIEW OF SYSTEMS: ROS    Positive for: Musculoskeletal, Cardiovascular, Eyes   Negative for: Constitutional, Gastrointestinal, Neurological, Skin, Genitourinary, HENT, Endocrine, Respiratory, Psychiatric, Allergic/Imm, Heme/Lymph   Last edited by Corrinne EagleEnglish, Ashley L on 05/07/2020  8:48 AM. (History)       ALLERGIES Allergies  Allergen Reactions   Peanut-Containing Drug Products     Lips tingle   Penicillins    Asa [Aspirin] Swelling    Throat,eyes   Bactrim [Sulfamethoxazole-Trimethoprim] Swelling    face   Biaxin [Clarithromycin] Swelling    face   Cortisone Swelling    face   Erythromycin Other (See Comments)    Sores on face   Tylenol [Acetaminophen] Swelling    face    PAST MEDICAL HISTORY Past Medical History:  Diagnosis Date   Arrhythmia    afib   Atrial fibrillation (HCC)    Cataract    Mixed form OU   Hypertensive retinopathy    OU   Macular degeneration    OU   Meniere disease    Raynaud disease    Ulcerative colitis (HCC)    Past Surgical History:  Procedure Laterality Date   TONSILLECTOMY AND ADENOIDECTOMY      FAMILY HISTORY Family History  Problem Relation Age of Onset  Aortic aneurysm Mother    Lung disease Father    Aortic aneurysm Maternal Aunt    Coronary artery disease Paternal Grandmother 5       died   Heart attack Paternal Grandmother     SOCIAL HISTORY Social History   Tobacco Use   Smoking status: Former Smoker    Packs/day: 1.00    Years: 10.00    Pack years: 10.00    Types: Cigarettes    Start date: 06/30/1959    Quit date: 06/29/1969    Years since quitting: 50.8   Smokeless tobacco: Never Used  Vaping Use   Vaping Use: Never used  Substance Use Topics   Alcohol use: No    Alcohol/week: 0.0 standard drinks   Drug use: No         OPHTHALMIC EXAM:  Base Eye Exam    Visual Acuity (Snellen - Linear)      Right Left   Dist [place] 20/60 +1 20/50 -1   Dist ph [place]  20/40 +2 20/25       Tonometry (Tonopen, 8:53 AM)      Right Left   Pressure 18 def  Pt refuses IOP check OS       Pupils      Dark Light Shape React APD   Right 2 1 Round Minimal 0   Left 2 1 Round Minimal 0       Visual Fields      Left Right    Full Full       Extraocular Movement      Right Left    Full Full       Neuro/Psych    Oriented x3: Yes   Mood/Affect: Normal       Dilation    Right eye: 1.0% Mydriacyl, 2.5% Phenylephrine @ 8:53 AM  Pt refuses dilation/IOP check OS        Slit Lamp and Fundus Exam    Slit Lamp Exam      Right Left   Lids/Lashes Dermatochalasis - upper lid Dermatochalasis - upper lid   Conjunctiva/Sclera White and quiet White and quiet   Cornea Mild arcus, Debris in tear film Mild arcus, Debris in tear film   Anterior Chamber deep, narrow temporal angle deep, narrow temporal angle   Iris Round and dilated Round and reactive -- not dilated   Lens 3+ Nuclear sclerosis with early brunescence, 2-3+ Cortical cataract 2+ Nuclear sclerosis, 2+ Cortical cataract   Vitreous Vitreous syneresis Poor view due to no dilation       Fundus Exam      Right Left   Disc Compact, tilted, Pink and Sharp, peripapillary CNV with SRH from 0900-1200 -- slightly improved and fading no view   C/D Ratio 0.3    Macula Blunted foveal reflex, central Drusen, RPE mottling and clumping, SRH SN macula -- improving no view   Vessels Vascular attenuation, mild tortuousity no view   Periphery Attached, No RT/RD no view          IMAGING AND PROCEDURES  Imaging and Procedures for 05/07/2020  OCT, Retina - OU - Both Eyes       Right Eye Quality was good. Central Foveal Thickness: 263. Progression has improved. Findings include normal foveal contour, no IRF, pigment epithelial detachment, retinal drusen , subretinal fluid, subretinal hyper-reflective material (Mild interval improvement in peripapillary SRF/SRHM/PED ST disc, central drusen -- stable).   Left  Eye Quality was good. Central Foveal Thickness: 270. Progression has  been stable. Findings include normal foveal contour, no IRF, no SRF, retinal drusen , pigment epithelial detachment.   Notes *Images captured and stored on drive  Diagnosis / Impression:  OD: exu ARMD with Mild interval improvement in peripapillary SRF/SRHM/PED ST disc OS: Non-exu ARMD  Clinical management:  See below  Abbreviations: NFP - Normal foveal profile. CME - cystoid macular edema. PED - pigment epithelial detachment. IRF - intraretinal fluid. SRF - subretinal fluid. EZ - ellipsoid zone. ERM - epiretinal membrane. ORA - outer retinal atrophy. ORT - outer retinal tubulation. SRHM - subretinal hyper-reflective material. IRHM - intraretinal hyper-reflective material        Intravitreal Injection, Pharmacologic Agent - OD - Right Eye       Time Out 05/07/2020. 9:04 AM. Confirmed correct patient, procedure, site, and patient consented.   Anesthesia Topical anesthesia was used. Anesthetic medications included Lidocaine 2%, Proparacaine 0.5%.   Procedure Preparation included 5% betadine to ocular surface, eyelid speculum. A supplied needle was used.   Injection:  1.25 mg Bevacizumab (AVASTIN) SOLN   NDC: 57322-025-42, Lot: [id number] , Expiration date: 06/25/2020   Route: Intravitreal, Site: Right Eye, Waste: 0 mL  Post-op Post injection exam found visual acuity of at least counting fingers. The patient tolerated the procedure well. There were no complications. The patient received written and verbal post procedure care education. Post injection medications were not given.                 ASSESSMENT/PLAN:    ICD-10-CM   1. Exudative age-related macular degeneration of right eye with active choroidal neovascularization (HCC)  H35.3211 Intravitreal Injection, Pharmacologic Agent - OD - Right Eye    Bevacizumab (AVASTIN) SOLN 1.25 mg  2. Retinal edema  H35.81 OCT, Retina - OU - Both Eyes  3.  Intermediate stage nonexudative age-related macular degeneration of left eye  H35.3122   4. Essential hypertension  I10   5. Hypertensive retinopathy of both eyes  H35.033   6. Combined forms of age-related cataract of both eyes  H25.813     1,2. Exudative age related macular degeneration, right eye   - pt reports brown spot in vision at 5 oclock visual field -- improving  - IVA OD #1 (09.14.21), #2 (10.12.21)  - peripapillary CNV off ST disc -- +subretinal heme    - OCT interval improvement in peripapillary SRF/SRHM/PED ST disc OD -- no central involvement  - BCVA stable at 20/40 +2             - Recommend IVA OD #3 today, 11.09.21  - pt wishes to be treated with IVA  - RBA of procedure discussed, questions answered   - informed consent obtained and signed  - see procedure note  - f/u in 4 wks, DFE, OCT, possible injection  3. Age related macular degeneration, non-exudative, left eye  - The incidence, anatomy, and pathology of dry AMD, risk of progression, and the AREDS and AREDS 2 study including smoking risks discussed with patient.  - Recommend amsler grid monitoring  4,5. Hypertensive retinopathy OU - discussed importance of tight BP control - monitor  6. Mixed Cataract OU - The symptoms of cataract, surgical options, and treatments and risks were discussed with patient. - discussed diagnosis and progression - not yet visually significant - monitor for now  Ophthalmic Meds Ordered this visit:  Meds ordered this encounter  Medications   Bevacizumab (AVASTIN) SOLN 1.25 mg      Return in about 4 weeks (around 06/04/2020)  for f/u exu ARMD OD, DFE, OCT.  There are no Patient Instructions on file for this visit.   Explained the diagnoses, plan, and follow up with the patient and they expressed understanding.  Patient expressed understanding of the importance of proper follow up care.  This document serves as a record of services personally performed by Karie Chimera,  MD, PhD. It was created on their behalf by Cristopher Estimable, COT an ophthalmic technician. The creation of this record is the provider's dictation and/or activities during the visit.    Electronically signed by: Cristopher Estimable, COT 11.4.21 @ 9:18 PM   This document serves as a record of services personally performed by Karie Chimera, MD, PhD. It was created on their behalf by Glee Arvin. Manson Passey, OA an ophthalmic technician. The creation of this record is the provider's dictation and/or activities during the visit.    Electronically signed by: Glee Arvin. Manson Passey, [place] 11.09.2021 9:18 PM  Karie Chimera, M.D., Ph.D. Diseases & Surgery of the Retina and Vitreous [facility] 05/07/2020   I have reviewed the above documentation for accuracy and completeness, and I agree with the above. Karie Chimera, M.D., Ph.D. 05/07/20 9:24 PM   Abbreviations: M myopia (nearsighted); A astigmatism; H hyperopia (farsighted); P presbyopia; Mrx spectacle prescription;  CTL contact lenses; OD right eye; OS left eye; OU both eyes  XT exotropia; ET esotropia; PEK punctate epithelial keratitis; PEE punctate epithelial erosions; DES dry eye syndrome; MGD meibomian gland dysfunction; ATs artificial tears; PFAT's preservative free artificial tears; NSC nuclear sclerotic cataract; PSC posterior subcapsular cataract; ERM epi-retinal membrane; PVD posterior vitreous detachment; RD retinal detachment; DM diabetes mellitus; DR diabetic retinopathy; NPDR non-proliferative diabetic retinopathy; PDR proliferative diabetic retinopathy; CSME clinically significant macular edema; DME diabetic macular edema; dbh dot blot hemorrhages; CWS cotton wool spot; POAG primary open angle glaucoma; C/D cup-to-disc ratio; HVF humphrey visual field; GVF goldmann visual field; OCT optical coherence tomography; IOP intraocular pressure; BRVO Branch retinal vein occlusion; CRVO central retinal vein occlusion; CRAO central retinal artery  occlusion; BRAO branch retinal artery occlusion; RT retinal tear; SB scleral buckle; PPV pars plana vitrectomy; VH Vitreous hemorrhage; PRP panretinal laser photocoagulation; IVK intravitreal kenalog; VMT vitreomacular traction; MH Macular hole;  NVD neovascularization of the disc; NVE neovascularization elsewhere; AREDS age related eye disease study; ARMD age related macular degeneration; POAG primary open angle glaucoma; EBMD epithelial/anterior basement membrane dystrophy; ACIOL anterior chamber intraocular lens; IOL intraocular lens; PCIOL posterior chamber intraocular lens; Phaco/IOL phacoemulsification with intraocular lens placement; PRK photorefractive keratectomy; LASIK laser assisted in situ keratomileusis; HTN hypertension; DM diabetes mellitus; COPD chronic obstructive pulmonary disease

## 2020-05-07 ENCOUNTER — Other Ambulatory Visit: Payer: Self-pay

## 2020-05-07 ENCOUNTER — Encounter (INDEPENDENT_AMBULATORY_CARE_PROVIDER_SITE_OTHER): Payer: Self-pay | Admitting: Ophthalmology

## 2020-05-07 ENCOUNTER — Ambulatory Visit (INDEPENDENT_AMBULATORY_CARE_PROVIDER_SITE_OTHER): Payer: Medicare HMO | Admitting: Ophthalmology

## 2020-05-07 DIAGNOSIS — H353122 Nonexudative age-related macular degeneration, left eye, intermediate dry stage: Secondary | ICD-10-CM | POA: Diagnosis not present

## 2020-05-07 DIAGNOSIS — H3581 Retinal edema: Secondary | ICD-10-CM

## 2020-05-07 DIAGNOSIS — H25813 Combined forms of age-related cataract, bilateral: Secondary | ICD-10-CM | POA: Diagnosis not present

## 2020-05-07 DIAGNOSIS — I10 Essential (primary) hypertension: Secondary | ICD-10-CM

## 2020-05-07 DIAGNOSIS — H35033 Hypertensive retinopathy, bilateral: Secondary | ICD-10-CM | POA: Diagnosis not present

## 2020-05-07 DIAGNOSIS — H353211 Exudative age-related macular degeneration, right eye, with active choroidal neovascularization: Secondary | ICD-10-CM | POA: Diagnosis not present

## 2020-05-07 MED ORDER — BEVACIZUMAB CHEMO INJECTION 1.25MG/0.05ML SYRINGE FOR KALEIDOSCOPE
1.2500 mg | INTRAVITREAL | Status: AC | PRN
  Administered 2020-05-07: 1.25 mg via INTRAVITREAL

## 2020-06-06 NOTE — Progress Notes (Signed)
[facility] - Clinic Note  06/11/2020     CHIEF COMPLAINT Patient presents for Retina Follow Up   HISTORY OF PRESENT ILLNESS: Cassidy Miller is a 74 y.o. female who presents to the clinic today for:  HPI    Retina Follow Up    Patient presents with  Wet AMD.  In right eye.  This started 4 weeks ago.  I, the attending physician,  performed the HPI with the patient and updated documentation appropriately.          Comments    Patient here for 4 weeks retina follow up for exu ARMD OD. Patient states vision is ok. No eye pain.       Last edited by Rennis Chris, MD on 06/11/2020  9:50 AM. (History)    pt states she is delayed to 5 weeks instead of 4 bc she was "busy", she states the black spot she was seeing is gone, but now she sees a "wave" nasally  Referring physician: Lawerance Sabal, PA [address] [place],  [place] [address]  HISTORICAL INFORMATION:   Selected notes from the MEDICAL RECORD NUMBER Referred by Dr. Alben Spittle for eval of parapapillary CNVM OD   CURRENT MEDICATIONS: No current outpatient medications on file. (Ophthalmic Drugs)   No current facility-administered medications for this visit. (Ophthalmic Drugs)   Current Outpatient Medications (Other)  Medication Sig  . Cholecalciferol (VITAMIN D) 125 MCG (5000 UT) CAPS Take 1 capsule by mouth daily.  . flecainide (TAMBOCOR) 50 MG tablet Take 1 tablet (50 mg total) by mouth 2 (two) times daily.  . meclizine (ANTIVERT) 25 MG tablet Take 25 mg by mouth 3 (three) times daily as needed for dizziness.  . metoprolol succinate (TOPROL-XL) 25 MG 24 hr tablet Take 1 tablet (25 mg total) by mouth 2 (two) times daily.  . promethazine (PHENERGAN) 12.5 MG tablet   . vitamin C (ASCORBIC ACID) 500 MG tablet Take 500 mg by mouth daily.  Marland Kitchen zinc gluconate 50 MG tablet Take 50 mg by mouth daily.   No current facility-administered medications for this visit. (Other)      REVIEW OF SYSTEMS: ROS    Positive for:  Musculoskeletal, Cardiovascular, Eyes   Negative for: Constitutional, Gastrointestinal, Neurological, Skin, Genitourinary, HENT, Endocrine, Respiratory, Psychiatric, Allergic/Imm, Heme/Lymph   Last edited by Laddie Aquas, COA on 06/11/2020  8:35 AM. (History)       ALLERGIES Allergies  Allergen Reactions  . Peanut-Containing Drug Products     Lips tingle  . Penicillins   . Asa [Aspirin] Swelling    Throat,eyes  . Bactrim [Sulfamethoxazole-Trimethoprim] Swelling    face  . Biaxin [Clarithromycin] Swelling    face  . Cortisone Swelling    face  . Erythromycin Other (See Comments)    Sores on face  . Tylenol [Acetaminophen] Swelling    face    PAST MEDICAL HISTORY Past Medical History:  Diagnosis Date  . Arrhythmia    afib  . Atrial fibrillation (HCC)   . Cataract    Mixed form OU  . Hypertensive retinopathy    OU  . Macular degeneration    OU  . Meniere disease   . Raynaud disease   . Ulcerative colitis [facility])    Past Surgical History:  Procedure Laterality Date  . TONSILLECTOMY AND ADENOIDECTOMY      FAMILY HISTORY Family History  Problem Relation Age of Onset  . Aortic aneurysm Mother   . Lung disease Father   .  Aortic aneurysm Maternal Aunt   . Coronary artery disease Paternal Grandmother [id number]       died  . Heart attack Paternal Grandmother     SOCIAL HISTORY Social History   Tobacco Use  . Smoking status: Former Smoker    Packs/day: 1.00    Years: 10.00    Pack years: 10.00    Types: Cigarettes    Start date: 06/30/1959    Quit date: 06/29/1969    Years since quitting: 50.9  . Smokeless tobacco: Never Used  Vaping Use  . Vaping Use: Never used  Substance Use Topics  . Alcohol use: No    Alcohol/week: 0.0 standard drinks  . Drug use: No         OPHTHALMIC EXAM:  Base Eye Exam    Visual Acuity (Snellen - Linear)      Right Left   Dist [place] 20/40 20/50 +2   Dist ph [place] 20/30 -2 20/30 -1       Tonometry (Tonopen, 8:33 AM)       Right Left   Pressure 21 21       Pupils      Dark Light Shape React APD   Right 2 1 Round Minimal None   Left 2 1 Round Minimal None       Visual Fields (Counting fingers)      Left Right    Full Full       Extraocular Movement      Right Left    Full Full       Neuro/Psych    Oriented x3: Yes   Mood/Affect: Normal       Dilation    Right eye: 1.0% Mydriacyl, 2.5% Phenylephrine @ 8:32 AM  Patient only wanted OD dilated. Gets vertigo when both eyes are dilated.,        Slit Lamp and Fundus Exam    Slit Lamp Exam      Right Left   Lids/Lashes Dermatochalasis - upper lid, mild MGD Dermatochalasis - upper lid   Conjunctiva/Sclera White and quiet White and quiet   Cornea Mild arcus, Debris in tear film, 1+ Punctate epithelial erosions Mild arcus, Debris in tear film, 1+ Punctate epithelial erosions   Anterior Chamber deep, narrow temporal angle deep, narrow temporal angle   Iris Round and dilated Round and reactive -- not dilated   Lens 3+ Nuclear sclerosis with early brunescence, 3+ Cortical cataract 2+ Nuclear sclerosis, 2+ Cortical cataract   Vitreous Vitreous syneresis, Posterior vitreous detachment Poor view due to no dilation       Fundus Exam      Right Left   Disc Compact, tilted, Pink and Sharp, peripapillary CNV with SRH from 0900-1200 -- improving no view   C/D Ratio 0.3    Macula Blunted foveal reflex, central Drusen, RPE mottling and clumping, interval increase in nasal PED, +SRH SN macula -- improving no view   Vessels attenuated, Tortuous no view   Periphery Attached, No RT/RD no view          IMAGING AND PROCEDURES  Imaging and Procedures for 06/11/2020  OCT, Retina - OU - Both Eyes       Right Eye Quality was good. Central Foveal Thickness: 265. Progression has worsened. Findings include normal foveal contour, no IRF, pigment epithelial detachment, retinal drusen , subretinal fluid, subretinal hyper-reflective material (interval worsening  of nasal PED, but mild improvement in peripapillary SRF/IRF).   Left Eye Quality was good. Central Foveal Thickness:  277. Progression has been stable. Findings include normal foveal contour, no IRF, no SRF, retinal drusen , pigment epithelial detachment.   Notes *Images captured and stored on drive  Diagnosis / Impression:  OD: exu ARMD with interval worsening of nasal PED, but mild improvement in peripapillary SRF/IRF OS: Non-exu ARMD  Clinical management:  See below  Abbreviations: NFP - Normal foveal profile. CME - cystoid macular edema. PED - pigment epithelial detachment. IRF - intraretinal fluid. SRF - subretinal fluid. EZ - ellipsoid zone. ERM - epiretinal membrane. ORA - outer retinal atrophy. ORT - outer retinal tubulation. SRHM - subretinal hyper-reflective material. IRHM - intraretinal hyper-reflective material        Intravitreal Injection, Pharmacologic Agent - OD - Right Eye       Time Out 06/11/2020. 9:24 AM. Confirmed correct patient, procedure, site, and patient consented.   Anesthesia Topical anesthesia was used. Anesthetic medications included Lidocaine 2%, Proparacaine 0.5%.   Procedure Preparation included 5% betadine to ocular surface, eyelid speculum. A supplied needle was used.   Injection:  1.25 mg Bevacizumab (AVASTIN) 1.25mg /0.63mL SOLN   NDC: 37858-850-27, Lot: [id number] , Expiration date: 07/24/2020   Route: Intravitreal, Site: Right Eye, Waste: 0 mL  Post-op Post injection exam found visual acuity of at least counting fingers. The patient tolerated the procedure well. There were no complications. The patient received written and verbal post procedure care education. Post injection medications were not given.                 ASSESSMENT/PLAN:    ICD-10-CM   1. Exudative age-related macular degeneration of right eye with active choroidal neovascularization (HCC)  H35.3211 Intravitreal Injection, Pharmacologic Agent - OD - Right Eye     Bevacizumab (AVASTIN) SOLN 1.25 mg  2. Retinal edema  H35.81 OCT, Retina - OU - Both Eyes  3. Intermediate stage nonexudative age-related macular degeneration of left eye  H35.3122   4. Essential hypertension  I10   5. Hypertensive retinopathy of both eyes  H35.033   6. Combined forms of age-related cataract of both eyes  H25.813     1,2. Exudative age related macular degeneration, right eye   - pt reports brown spot in vision at 5 oclock visual field -- improving  - IVA OD #1 (09.14.21), #2 (10.12.21), #3 (11.9.21)  - peripapillary CNV off ST disc -- +subretinal heme improving   - OCT w/ interval worsening of nasal PED, but mild improvement in peripapillary SRF/IRF  - BCVA improved to 20/30 from 20/40 OD             - Recommend IVA OD #4 today, 12.14.21  - pt wishes to be treated with IVA  - RBA of procedure discussed, questions answered   - informed consent obtained and signed  - see procedure note  - f/u in 4 wks, DFE, OCT, possible injection  3. Age related macular degeneration, non-exudative, left eye  - The incidence, anatomy, and pathology of dry AMD, risk of progression, and the AREDS and AREDS 2 study including smoking risks discussed with patient.  - Recommend amsler grid monitoring  4,5. Hypertensive retinopathy OU - discussed importance of tight BP control - monitor  6. Mixed Cataract OU - The symptoms of cataract, surgical options, and treatments and risks were discussed with patient. - discussed diagnosis and progression - approaching visual significance  Ophthalmic Meds Ordered this visit:  Meds ordered this encounter  Medications  . Bevacizumab (AVASTIN) SOLN 1.25 mg      Return  in about 4 weeks (around 07/09/2020) for f/u exu ARMD OD, DFE, OCT.  There are no Patient Instructions on file for this visit.   Explained the diagnoses, plan, and follow up with the patient and they expressed understanding.  Patient expressed understanding of the importance of  proper follow up care.  This document serves as a record of services personally performed by Karie Chimera, MD, PhD. It was created on their behalf by Glee Arvin. Manson Passey, OA an ophthalmic technician. The creation of this record is the provider's dictation and/or activities during the visit.    Electronically signed by: Glee Arvin. Manson Passey, [place] 12.14.2021 9:54 AM  Karie Chimera, M.D., Ph.D. Diseases & Surgery of the Retina and Vitreous [facility] 06/11/2020   I have reviewed the above documentation for accuracy and completeness, and I agree with the above. Karie Chimera, M.D., Ph.D. 06/11/20 9:54 AM   Abbreviations: M myopia (nearsighted); A astigmatism; H hyperopia (farsighted); P presbyopia; Mrx spectacle prescription;  CTL contact lenses; OD right eye; OS left eye; OU both eyes  XT exotropia; ET esotropia; PEK punctate epithelial keratitis; PEE punctate epithelial erosions; DES dry eye syndrome; MGD meibomian gland dysfunction; ATs artificial tears; PFAT's preservative free artificial tears; NSC nuclear sclerotic cataract; PSC posterior subcapsular cataract; ERM epi-retinal membrane; PVD posterior vitreous detachment; RD retinal detachment; DM diabetes mellitus; DR diabetic retinopathy; NPDR non-proliferative diabetic retinopathy; PDR proliferative diabetic retinopathy; CSME clinically significant macular edema; DME diabetic macular edema; dbh dot blot hemorrhages; CWS cotton wool spot; POAG primary open angle glaucoma; C/D cup-to-disc ratio; HVF humphrey visual field; GVF goldmann visual field; OCT optical coherence tomography; IOP intraocular pressure; BRVO Branch retinal vein occlusion; CRVO central retinal vein occlusion; CRAO central retinal artery occlusion; BRAO branch retinal artery occlusion; RT retinal tear; SB scleral buckle; PPV pars plana vitrectomy; VH Vitreous hemorrhage; PRP panretinal laser photocoagulation; IVK intravitreal kenalog; VMT vitreomacular traction;  MH Macular hole;  NVD neovascularization of the disc; NVE neovascularization elsewhere; AREDS age related eye disease study; ARMD age related macular degeneration; POAG primary open angle glaucoma; EBMD epithelial/anterior basement membrane dystrophy; ACIOL anterior chamber intraocular lens; IOL intraocular lens; PCIOL posterior chamber intraocular lens; Phaco/IOL phacoemulsification with intraocular lens placement; PRK photorefractive keratectomy; LASIK laser assisted in situ keratomileusis; HTN hypertension; DM diabetes mellitus; COPD chronic obstructive pulmonary disease

## 2020-06-11 ENCOUNTER — Encounter (INDEPENDENT_AMBULATORY_CARE_PROVIDER_SITE_OTHER): Payer: Self-pay | Admitting: Ophthalmology

## 2020-06-11 ENCOUNTER — Ambulatory Visit (INDEPENDENT_AMBULATORY_CARE_PROVIDER_SITE_OTHER): Payer: Medicare HMO | Admitting: Ophthalmology

## 2020-06-11 ENCOUNTER — Other Ambulatory Visit: Payer: Self-pay

## 2020-06-11 DIAGNOSIS — H25813 Combined forms of age-related cataract, bilateral: Secondary | ICD-10-CM | POA: Diagnosis not present

## 2020-06-11 DIAGNOSIS — H353122 Nonexudative age-related macular degeneration, left eye, intermediate dry stage: Secondary | ICD-10-CM | POA: Diagnosis not present

## 2020-06-11 DIAGNOSIS — H35033 Hypertensive retinopathy, bilateral: Secondary | ICD-10-CM

## 2020-06-11 DIAGNOSIS — H353211 Exudative age-related macular degeneration, right eye, with active choroidal neovascularization: Secondary | ICD-10-CM

## 2020-06-11 DIAGNOSIS — I10 Essential (primary) hypertension: Secondary | ICD-10-CM

## 2020-06-11 DIAGNOSIS — H3581 Retinal edema: Secondary | ICD-10-CM

## 2020-06-11 MED ORDER — BEVACIZUMAB CHEMO INJECTION 1.25MG/0.05ML SYRINGE FOR KALEIDOSCOPE
1.2500 mg | INTRAVITREAL | Status: AC | PRN
Start: 2020-06-11 — End: 2020-06-11
  Administered 2020-06-11: 10:00:00 1.25 mg via INTRAVITREAL

## 2020-07-09 ENCOUNTER — Encounter (INDEPENDENT_AMBULATORY_CARE_PROVIDER_SITE_OTHER): Payer: Medicare HMO | Admitting: Ophthalmology

## 2020-08-03 ENCOUNTER — Other Ambulatory Visit: Payer: Self-pay | Admitting: Family Medicine

## 2020-08-03 DIAGNOSIS — I480 Paroxysmal atrial fibrillation: Secondary | ICD-10-CM

## 2020-10-24 ENCOUNTER — Other Ambulatory Visit: Payer: Self-pay | Admitting: Family Medicine

## 2020-10-24 DIAGNOSIS — I480 Paroxysmal atrial fibrillation: Secondary | ICD-10-CM

## 2020-10-27 NOTE — Progress Notes (Signed)
Cardiology Office Note  Date: 10/28/2020   ID: Cassidy Miller, DOB [birth year], MRN [id number]  PCP:  Lawerance Sabal, PA  Cardiologist:  Prentice Docker, MD (Inactive) Electrophysiologist:  None   Chief Complaint: Cardiac follow up  History of Present Illness: Cassidy Miller is a 75 y.o. female with a history of  Atrial fibrillation / flutter, HTN.  Last seen by Dr Purvis Sheffield 3//2021 via telemedicine. She was seldom having palpitations.  Her AF was symptomatically stable.  She was continuing Flecainide and Toprol XL. She had previously refused anticoagulation. She had deferred checking a flecainide level. BP was normal. There were no changes to therapy. A flecainide level was ordered.   She is here for 1 year follow-up today.  She denies any significant palpitations or arrhythmias.  She states she has occasional palpitations that are not bothersome which is routine for her.  She continues taking her flecainide 50 mg p.o. twice daily along with Toprol-XL 25 mg p.o. twice daily.  She denies any dizziness, lightheadedness, presyncope or syncopal episodes.  Denies any CVA or TIA-like symptoms, denies any anginal or exertional symptoms.  Denies any PND, orthopnea.  No claudication-like symptoms, DVT or PE-like symptoms, lower extremity edema.  States she has had both the initial COVID infection and omicron infections over the past 2 years.  She has some vision disturbances in her right eye and is being treated for macular degeneration by a specialist per her statement.  She was receiving intraocular injections.  She has a follow-up with her eye specialist in the near future.  Her blood pressure is up today but she states it is always up during doctor visits.  She states that it is normal at home.  Past Medical History:  Diagnosis Date  . Arrhythmia    afib  . Atrial fibrillation (HCC)   . Cataract    Mixed form OU  . Hypertensive retinopathy    OU  . Macular degeneration    OU  . Meniere  disease   . Raynaud disease   . Ulcerative colitis [facility])     Past Surgical History:  Procedure Laterality Date  . TONSILLECTOMY AND ADENOIDECTOMY      Current Outpatient Medications  Medication Sig Dispense Refill  . Cholecalciferol (VITAMIN D) 125 MCG (5000 UT) CAPS Take 1 capsule by mouth daily.    . meclizine (ANTIVERT) 12.5 MG tablet Take 12.5 mg by mouth 3 (three) times daily as needed for dizziness.    . vitamin C (ASCORBIC ACID) 500 MG tablet Take 500 mg by mouth daily.    . flecainide (TAMBOCOR) 50 MG tablet Take 1 tablet (50 mg total) by mouth 2 (two) times daily. 180 tablet 1  . metoprolol succinate (TOPROL-XL) 25 MG 24 hr tablet Take 1 tablet (25 mg total) by mouth 2 (two) times daily. 180 tablet 1   No current facility-administered medications for this visit.   Allergies:  Peanut-containing drug products, Penicillins, Asa [aspirin], Bactrim [sulfamethoxazole-trimethoprim], Biaxin [clarithromycin], Cortisone, Erythromycin, and Tylenol [acetaminophen]   Social History: The patient  reports that she quit smoking about 51 years ago. Her smoking use included cigarettes. She started smoking about 61 years ago. She has a 10.00 pack-year smoking history. She has never used smokeless tobacco. She reports that she does not drink alcohol and does not use drugs.   Family History: The patient's family history includes Aortic aneurysm in her maternal aunt and mother; Coronary artery disease (age of onset: 66) in her paternal grandmother;  Heart attack in her paternal grandmother; Lung disease in her father.   ROS:  Please see the history of present illness. Otherwise, complete review of systems is positive for none.  All other systems are reviewed and negative.   Physical Exam: VS:  BP (!) 142/88   Pulse 64   Ht 5\' 1"  (1.549 m)   Wt 116 lb (52.6 kg)   SpO2 95%   BMI 21.92 kg/m , BMI Body mass index is 21.92 kg/m.  Wt Readings from Last 3 Encounters:  10/28/20 116 lb (52.6 kg)   08/28/19 123 lb (55.8 kg)  08/25/18 129 lb (58.5 kg)    General: Patient appears comfortable at rest. Neck: Supple, no elevated JVP or carotid bruits, no thyromegaly. Lungs: Clear to auscultation, nonlabored breathing at rest. Cardiac: Regular rate and rhythm, no S3 or significant systolic murmur, no pericardial rub. Extremities: No pitting edema, distal pulses 2+. Skin: Warm and dry. Musculoskeletal: No kyphosis. Neuropsychiatric: Alert and oriented x3, affect grossly appropriate.  ECG:  An ECG dated 10/28/2020 was personally reviewed today and demonstrated:  Normal sinus rhythm rate of 62.  Recent Labwork: No results found for requested labs within last 8760 hours.  No results found for: CHOL, TRIG, HDL, CHOLHDL, VLDL, LDLCALC, LDLDIRECT  Other Studies Reviewed Today:   Assessment and Plan:  1. Paroxysmal atrial fibrillation (HCC)   2. Essential hypertension    1. Paroxysmal atrial fibrillation (HCC) States she has continued episodes of palpitations which are not bothersome.  She is not on anticoagulation due to previous refusal.  Continue flecainide 50 mg p.o. twice daily.  Continue Toprol-XL 25 mg p.o. twice daily.  Patient is requesting 90-day refills on both.  2. Essential hypertension Blood pressure elevated on arrival today at 142/88.  He states her blood pressure is always up when she visits medical provider.  States blood pressure is normal at home.   Medication Adjustments/Labs and Tests Ordered: Current medicines are reviewed at length with the patient today.  Concerns regarding medicines are outlined above.   Disposition: Follow-up with Dr. 12/28/2020 or APP 1 year  Signed, Wyline Mood, NP 10/28/2020 10:54 AM    [facility] at [facility] [address] [place], [place], [place] Phone: [phone]; Fax: [phone]

## 2020-10-28 ENCOUNTER — Ambulatory Visit (INDEPENDENT_AMBULATORY_CARE_PROVIDER_SITE_OTHER): Payer: Medicare HMO | Admitting: Family Medicine

## 2020-10-28 ENCOUNTER — Other Ambulatory Visit: Payer: Self-pay

## 2020-10-28 ENCOUNTER — Encounter: Payer: Self-pay | Admitting: Family Medicine

## 2020-10-28 VITALS — BP 142/88 | HR 64 | Ht 61.0 in | Wt 116.0 lb

## 2020-10-28 DIAGNOSIS — I10 Essential (primary) hypertension: Secondary | ICD-10-CM | POA: Diagnosis not present

## 2020-10-28 DIAGNOSIS — I480 Paroxysmal atrial fibrillation: Secondary | ICD-10-CM | POA: Diagnosis not present

## 2020-10-28 MED ORDER — METOPROLOL SUCCINATE ER 25 MG PO TB24: 1.0000 | ORAL_TABLET | Freq: Two times a day (BID) | ORAL | 1 refills | Status: DC

## 2020-10-28 MED ORDER — FLECAINIDE ACETATE 50 MG PO TABS: 50.0000 mg | ORAL_TABLET | Freq: Two times a day (BID) | ORAL | 1 refills | Status: DC

## 2020-10-28 NOTE — Patient Instructions (Signed)
Your physician recommends that you schedule a follow-up appointment in: 1 YEAR WITH MD   Your physician recommends that you continue on your current medications as directed. Please refer to the Current Medication list given to you today.  Thank you for choosing [facility]!!    

## 2020-12-10 DIAGNOSIS — H35721 Serous detachment of retinal pigment epithelium, right eye: Secondary | ICD-10-CM | POA: Diagnosis not present

## 2020-12-10 DIAGNOSIS — H353211 Exudative age-related macular degeneration, right eye, with active choroidal neovascularization: Secondary | ICD-10-CM | POA: Diagnosis not present

## 2020-12-10 DIAGNOSIS — H353121 Nonexudative age-related macular degeneration, left eye, early dry stage: Secondary | ICD-10-CM | POA: Diagnosis not present

## 2020-12-10 DIAGNOSIS — H2513 Age-related nuclear cataract, bilateral: Secondary | ICD-10-CM | POA: Diagnosis not present

## 2020-12-11 ENCOUNTER — Encounter (INDEPENDENT_AMBULATORY_CARE_PROVIDER_SITE_OTHER): Payer: Medicare HMO | Admitting: Ophthalmology

## 2020-12-12 NOTE — Progress Notes (Signed)
[facility] - Clinic Note  12/16/2020     CHIEF COMPLAINT Patient presents for Retina Evaluation   HISTORY OF PRESENT ILLNESS: Cassidy Miller is a 75 y.o. female who presents to the clinic today for:  HPI     Retina Evaluation   In right eye.  I, the attending physician,  performed the HPI with the patient and updated documentation appropriately.        Comments   Patient here for Retina Evaluation. Referred by Dr Elmer PickerHecker. Patient states vision not good. Has gotten worse. After last shot whole right side of face was in pain for 4 days. No pain now. On occasion it is bothersome. The lump is larger and leaking fluid has started again.       Last edited by Rennis ChrisZamora, Brissia Delisa, MD on 12/16/2020  8:44 AM.     Pt is delayed to follow up from 4 weeks to 6 months, she states she did not come back bc she was in a lot of pain after her last injection, she states her vision has gotten worse since last visit and she is unable to read with her right eye  Referring physician: Dimitri PedWeaver, Christopher D, MD [address] [place] [id number]  HISTORICAL INFORMATION:   Selected notes from the MEDICAL RECORD NUMBER Referred by Dr. Alben SpittleWeaver for eval of parapapillary CNVM OD   CURRENT MEDICATIONS: No current outpatient medications on file. (Ophthalmic Drugs)   No current facility-administered medications for this visit. (Ophthalmic Drugs)   Current Outpatient Medications (Other)  Medication Sig   Cholecalciferol (VITAMIN D) 125 MCG (5000 UT) CAPS Take 1 capsule by mouth daily.   flecainide (TAMBOCOR) 50 MG tablet Take 1 tablet (50 mg total) by mouth 2 (two) times daily.   meclizine (ANTIVERT) 12.5 MG tablet Take 12.5 mg by mouth 3 (three) times daily as needed for dizziness.   metoprolol succinate (TOPROL-XL) 25 MG 24 hr tablet Take 1 tablet (25 mg total) by mouth 2 (two) times daily.   vitamin C (ASCORBIC ACID) 500 MG tablet Take 500 mg by mouth daily.   No current  facility-administered medications for this visit. (Other)      REVIEW OF SYSTEMS: ROS   Positive for: Musculoskeletal, Cardiovascular, Eyes Negative for: Constitutional, Gastrointestinal, Neurological, Skin, Genitourinary, HENT, Endocrine, Respiratory, Psychiatric, Allergic/Imm, Heme/Lymph Last edited by Laddie Aquaslarke, Rebecca S, COA on 12/16/2020  8:12 AM.        ALLERGIES Allergies  Allergen Reactions   Peanut-Containing Drug Products     Lips tingle   Penicillins    Asa [Aspirin] Swelling    Throat,eyes   Bactrim [Sulfamethoxazole-Trimethoprim] Swelling    face   Biaxin [Clarithromycin] Swelling    face   Cortisone Swelling    face   Erythromycin Other (See Comments)    Sores on face   Tylenol [Acetaminophen] Swelling    face    PAST MEDICAL HISTORY Past Medical History:  Diagnosis Date   Arrhythmia    afib   Atrial fibrillation (HCC)    Cataract    Mixed form OU   Hypertensive retinopathy    OU   Macular degeneration    OU   Meniere disease    Raynaud disease    Ulcerative colitis (HCC)    Past Surgical History:  Procedure Laterality Date   TONSILLECTOMY AND ADENOIDECTOMY      FAMILY HISTORY Family History  Problem Relation Age of Onset   Aortic aneurysm Mother  Lung disease Father    Aortic aneurysm Maternal Aunt    Coronary artery disease Paternal Grandmother 38       died   Heart attack Paternal Grandmother     SOCIAL HISTORY Social History   Tobacco Use   Smoking status: Former    Packs/day: 1.00    Years: 10.00    Pack years: 10.00    Types: Cigarettes    Start date: 06/30/1959    Quit date: 06/29/1969    Years since quitting: 51.5   Smokeless tobacco: Never  Vaping Use   Vaping Use: Never used  Substance Use Topics   Alcohol use: No    Alcohol/week: 0.0 standard drinks   Drug use: No         OPHTHALMIC EXAM:  Base Eye Exam     Visual Acuity (Snellen - Linear)       Right Left   Dist [place] 20/70 -2 20/50 -1   Dist ph   20/40 -2 20/30         Tonometry (Tonopen, 8:10 AM)       Right Left   Pressure 21 22         Pupils       Dark Light Shape React APD   Right 2 1 Round Minimal None   Left 2 1 Round Minimal None         Visual Fields (Counting fingers)       Left Right    Full Full         Extraocular Movement       Right Left    Full Full         Neuro/Psych     Oriented x3: Yes   Mood/Affect: Normal         Dilation     Right eye: 1.0% Mydriacyl, 2.5% Phenylephrine @ 8:09 AM  Patient states gets vertigo if has both eye dilated. Only wanted OD dilated.          Slit Lamp and Fundus Exam     Slit Lamp Exam       Right Left   Lids/Lashes Dermatochalasis - upper lid, mild MGD Dermatochalasis - upper lid   Conjunctiva/Sclera White and quiet White and quiet   Cornea Mild arcus, Debris in tear film, trace Punctate epithelial erosions Mild arcus, Debris in tear film, trace Punctate epithelial erosions   Anterior Chamber deep, narrow temporal angle deep, narrow temporal angle   Iris Round and dilated Round and reactive -- not dilated -- pt reports vertigo with OU dilation   Lens 3+ Nuclear sclerosis with early brunescence, 3+ Cortical cataract 2+ Nuclear sclerosis, 2+ Cortical cataract   Vitreous Vitreous syneresis, Posterior vitreous detachment Poor view due to no dilation         Fundus Exam       Right Left   Disc Compact, tilted, Pink and Sharp, large peripapillary heme with SRH from 0900-1200 -- improving no view   C/D Ratio 0.3    Macula Blunted foveal reflex, central Drusen, RPE mottling and clumping, interval increase in nasal PED with surroundig SRF, +SRH nasal and inferior macula no view   Vessels attenuated, Tortuous no view   Periphery Attached, No RT/RD, peripheral cystoid degeneration no view            IMAGING AND PROCEDURES  Imaging and Procedures for 12/16/2020  OCT, Retina - OU - Both Eyes       Right Eye Quality was  good. Central  Foveal Thickness: 561. Progression has worsened. Findings include no IRF, pigment epithelial detachment, retinal drusen , subretinal fluid, subretinal hyper-reflective material, abnormal foveal contour (interval increase in nasal PED, re-development of surrounding SRF).   Left Eye Quality was good. Central Foveal Thickness: 278. Progression has been stable. Findings include normal foveal contour, no IRF, no SRF, retinal drusen , pigment epithelial detachment, subretinal hyper-reflective material.   Notes *Images captured and stored on drive  Diagnosis / Impression:  OD: exu ARMD with interval increase in nasal PED, re-development of surrounding SRF OS: Non-exu ARMD  Clinical management:  See below  Abbreviations: NFP - Normal foveal profile. CME - cystoid macular edema. PED - pigment epithelial detachment. IRF - intraretinal fluid. SRF - subretinal fluid. EZ - ellipsoid zone. ERM - epiretinal membrane. ORA - outer retinal atrophy. ORT - outer retinal tubulation. SRHM - subretinal hyper-reflective material. IRHM - intraretinal hyper-reflective material      Intravitreal Injection, Pharmacologic Agent - OD - Right Eye       Time Out 12/16/2020. 8:50 AM. Confirmed correct patient, procedure, site, and patient consented.   Anesthesia Topical anesthesia was used. Anesthetic medications included Lidocaine 2%, Proparacaine 0.5%.   Procedure Preparation included 5% betadine to ocular surface, eyelid speculum. A supplied needle was used.   Injection: 1.25 mg Bevacizumab 1.25mg /0.75ml   Route: Intravitreal, Site: Right Eye   NDC: [id number], Lot: [id number], Expiration date: 02/07/2021, Waste: 0.05 mL   Post-op Post injection exam found visual acuity of at least counting fingers. The patient tolerated the procedure well. There were no complications. The patient received written and verbal post procedure care education. Post injection medications were not given.                ASSESSMENT/PLAN:    ICD-10-CM   1. Exudative age-related macular degeneration of right eye with active choroidal neovascularization (HCC)  H35.3211 Intravitreal Injection, Pharmacologic Agent - OD - Right Eye    Bevacizumab (AVASTIN) SOLN 1.25 mg    2. Intermediate stage nonexudative age-related macular degeneration of left eye  H35.3122     3. Retinal edema  H35.81 OCT, Retina - OU - Both Eyes    4. Essential hypertension  I10     5. Hypertensive retinopathy of both eyes  H35.033     6. Combined forms of age-related cataract of both eyes  H25.813       1,2. Exudative age related macular degeneration, right eye   - delayed to follow up from 4 weeks to 6 months -- did not return due to pain after last injection (12.14.21)  - pt initially reported brown spot in vision at 5 oclock visual field  - IVA OD #1 (09.14.21), #2 (10.12.21), #3 (11.9.21), #4 (12.14.21)  - peripapillary CNV off ST disc  - OCT w/ interval increase in nasal PED, re-development of surrounding SRF  - BCVA decreased to 20/40 from 20/30 OD             - Recommend IVA OD #5 today, 06.20.22  - pt wishes to be treated with IVA  - RBA of procedure discussed, questions answered   - informed consent obtained and signed  - see procedure note  - f/u in 4 wks, DFE, OCT, possible injection  3. Age related macular degeneration, non-exudative, left eye  - The incidence, anatomy, and pathology of dry AMD, risk of progression, and the AREDS and AREDS 2 study including smoking risks discussed with patient.  - Recommend amsler grid  monitoring  4,5. Hypertensive retinopathy OU - discussed importance of tight BP control - monitor  6. Mixed Cataract OU - The symptoms of cataract, surgical options, and treatments and risks were discussed with patient. - discussed diagnosis and progression - approaching visual significance  Ophthalmic Meds Ordered this visit:  Meds ordered this encounter  Medications   Bevacizumab  (AVASTIN) SOLN 1.25 mg       Return in about 4 weeks (around 01/13/2021) for ex ARMD OD, Dilated Exam, OCT, Possible Injxn.  There are no Patient Instructions on file for this visit.  This document serves as a record of services personally performed by Karie Chimera, MD, PhD. It was created on their behalf by Herby Abraham, COA, an ophthalmic technician. The creation of this record is the provider's dictation and/or activities during the visit.    Electronically signed by: Herby Abraham, COA @TODAY @ 9:09 AM  This document serves as a record of services personally performed by , MD, PhD. It was created on their behalf by Karie Chimera. Glee Arvin, OA an ophthalmic technician. The creation of this record is the provider's dictation and/or activities during the visit.    Electronically signed by: Manson Passey. Glee Arvin, Manson Passey 06.20.2022 9:09 AM  06.22.2022, M.D., Ph.D. Diseases & Surgery of the Retina and Vitreous [facility] 12/16/2020   I have reviewed the above documentation for accuracy and completeness, and I agree with the above. 12/18/2020, M.D., Ph.D. 12/16/20 9:09 AM   Abbreviations: M myopia (nearsighted); A astigmatism; H hyperopia (farsighted); P presbyopia; Mrx spectacle prescription;  CTL contact lenses; OD right eye; OS left eye; OU both eyes  XT exotropia; ET esotropia; PEK punctate epithelial keratitis; PEE punctate epithelial erosions; DES dry eye syndrome; MGD meibomian gland dysfunction; ATs artificial tears; PFAT's preservative free artificial tears; NSC nuclear sclerotic cataract; PSC posterior subcapsular cataract; ERM epi-retinal membrane; PVD posterior vitreous detachment; RD retinal detachment; DM diabetes mellitus; DR diabetic retinopathy; NPDR non-proliferative diabetic retinopathy; PDR proliferative diabetic retinopathy; CSME clinically significant macular edema; DME diabetic macular edema; dbh dot blot hemorrhages; CWS cotton wool  spot; POAG primary open angle glaucoma; C/D cup-to-disc ratio; HVF humphrey visual field; GVF goldmann visual field; OCT optical coherence tomography; IOP intraocular pressure; BRVO Branch retinal vein occlusion; CRVO central retinal vein occlusion; CRAO central retinal artery occlusion; BRAO branch retinal artery occlusion; RT retinal tear; SB scleral buckle; PPV pars plana vitrectomy; VH Vitreous hemorrhage; PRP panretinal laser photocoagulation; IVK intravitreal kenalog; VMT vitreomacular traction; MH Macular hole;  NVD neovascularization of the disc; NVE neovascularization elsewhere; AREDS age related eye disease study; ARMD age related macular degeneration; POAG primary open angle glaucoma; EBMD epithelial/anterior basement membrane dystrophy; ACIOL anterior chamber intraocular lens; IOL intraocular lens; PCIOL posterior chamber intraocular lens; Phaco/IOL phacoemulsification with intraocular lens placement; PRK photorefractive keratectomy; LASIK laser assisted in situ keratomileusis; HTN hypertension; DM diabetes mellitus; COPD chronic obstructive pulmonary disease

## 2020-12-16 ENCOUNTER — Other Ambulatory Visit: Payer: Self-pay

## 2020-12-16 ENCOUNTER — Encounter (INDEPENDENT_AMBULATORY_CARE_PROVIDER_SITE_OTHER): Payer: Self-pay | Admitting: Ophthalmology

## 2020-12-16 ENCOUNTER — Ambulatory Visit (INDEPENDENT_AMBULATORY_CARE_PROVIDER_SITE_OTHER): Payer: Medicare HMO | Admitting: Ophthalmology

## 2020-12-16 DIAGNOSIS — H353122 Nonexudative age-related macular degeneration, left eye, intermediate dry stage: Secondary | ICD-10-CM

## 2020-12-16 DIAGNOSIS — H353211 Exudative age-related macular degeneration, right eye, with active choroidal neovascularization: Secondary | ICD-10-CM

## 2020-12-16 DIAGNOSIS — H35033 Hypertensive retinopathy, bilateral: Secondary | ICD-10-CM

## 2020-12-16 DIAGNOSIS — H25813 Combined forms of age-related cataract, bilateral: Secondary | ICD-10-CM | POA: Diagnosis not present

## 2020-12-16 DIAGNOSIS — H3581 Retinal edema: Secondary | ICD-10-CM | POA: Diagnosis not present

## 2020-12-16 DIAGNOSIS — I10 Essential (primary) hypertension: Secondary | ICD-10-CM | POA: Diagnosis not present

## 2020-12-16 MED ORDER — BEVACIZUMAB CHEMO INJECTION 1.25MG/0.05ML SYRINGE FOR KALEIDOSCOPE
1.2500 mg | INTRAVITREAL | Status: AC | PRN
  Administered 2020-12-16: 1.25 mg via INTRAVITREAL

## 2021-01-13 NOTE — Progress Notes (Signed)
[facility] Note  01/14/2021     CHIEF COMPLAINT Patient presents for Retina Follow Up   HISTORY OF PRESENT ILLNESS: Cassidy Miller is a 75 y.o. female who presents to the clinic today for:  HPI     Retina Follow Up   Patient presents with  Wet AMD.  In right eye.  Duration of 4 weeks.  Since onset it is stable.  I, the attending physician,  performed the HPI with the patient and updated documentation appropriately.        Comments   Pt here for 4 wk ret f/u exu ARMD OD. Pt states vision seems to be the same, no noticeable changes. No ocular pain or discomfort.       Last edited by Bernarda Caffey, MD on 01/14/2021  8:56 AM.    Pt states right eye is "not better"  Referring physician: Hortencia Pilar, MD [address],  [place] [address]  HISTORICAL INFORMATION:   Selected notes from the MEDICAL RECORD NUMBER Referred by Dr. Kathlen Mody for eval of parapapillary CNVM OD   CURRENT MEDICATIONS: No current outpatient medications on file. (Ophthalmic Drugs)   No current facility-administered medications for this visit. (Ophthalmic Drugs)   Current Outpatient Medications (Other)  Medication Sig   Cholecalciferol (VITAMIN D) 125 MCG (5000 UT) CAPS Take 1 capsule by mouth daily.   flecainide (TAMBOCOR) 50 MG tablet Take 1 tablet (50 mg total) by mouth 2 (two) times daily.   meclizine (ANTIVERT) 12.5 MG tablet Take 12.5 mg by mouth 3 (three) times daily as needed for dizziness.   metoprolol succinate (TOPROL-XL) 25 MG 24 hr tablet Take 1 tablet (25 mg total) by mouth 2 (two) times daily.   vitamin C (ASCORBIC ACID) 500 MG tablet Take 500 mg by mouth daily.   No current facility-administered medications for this visit. (Other)      REVIEW OF SYSTEMS: ROS   Positive for: Musculoskeletal, Cardiovascular, Eyes Negative for: Constitutional, Gastrointestinal, Neurological, Skin, Genitourinary, HENT, Endocrine, Respiratory, Psychiatric,  Allergic/Imm, Heme/Lymph Last edited by Kingsley Spittle, COT on 01/14/2021  7:47 AM.         ALLERGIES Allergies  Allergen Reactions   Peanut-Containing Drug Products     Lips tingle   Penicillins    Asa [Aspirin] Swelling    Throat,eyes   Bactrim [Sulfamethoxazole-Trimethoprim] Swelling    face   Biaxin [Clarithromycin] Swelling    face   Cortisone Swelling    face   Erythromycin Other (See Comments)    Sores on face   Tylenol [Acetaminophen] Swelling    face    PAST MEDICAL HISTORY Past Medical History:  Diagnosis Date   Arrhythmia    afib   Atrial fibrillation (HCC)    Cataract    Mixed form OU   Hypertensive retinopathy    OU   Macular degeneration    OU   Meniere disease    Raynaud disease    Ulcerative colitis ([place])    Past Surgical History:  Procedure Laterality Date   TONSILLECTOMY AND ADENOIDECTOMY      FAMILY HISTORY Family History  Problem Relation Age of Onset   Aortic aneurysm Mother    Lung disease Father    Aortic aneurysm Maternal Aunt    Coronary artery disease Paternal Grandmother 7       died   Heart attack Paternal Grandmother     SOCIAL HISTORY Social History   Tobacco Use  Smoking status: Former    Packs/day: 1.00    Years: 10.00    Pack years: 10.00    Types: Cigarettes    Start date: 06/30/1959    Quit date: 06/29/1969    Years since quitting: 51.5   Smokeless tobacco: Never  Vaping Use   Vaping Use: Never used  Substance Use Topics   Alcohol use: No    Alcohol/week: 0.0 standard drinks   Drug use: No         OPHTHALMIC EXAM:  Base Eye Exam     Visual Acuity (Snellen - Linear)       Right Left   Dist [place] 20/70 +1 20/40   Dist ph [place] 20/50 -2 20/30 +2         Tonometry (Tonopen, 7:54 AM)       Right Left   Pressure 16 16         Pupils       Dark Light Shape React APD   Right 2 1 Round Minimal None   Left 2 1 Round Minimal None         Visual Fields (Counting fingers)       Left  Right    Full Full         Extraocular Movement       Right Left    Full, Ortho Full, Ortho         Neuro/Psych     Oriented x3: Yes   Mood/Affect: Normal         Dilation     Right eye: 1.0% Mydriacyl, 2.5% Phenylephrine @ 7:55 AM  Pt only wanted OD dilated, states OU dilation leads to vertigo. MS          Slit Lamp and Fundus Exam     Slit Lamp Exam       Right Left   Lids/Lashes Dermatochalasis - upper lid, mild MGD Dermatochalasis - upper lid   Conjunctiva/Sclera White and quiet White and quiet   Cornea Mild arcus, Debris in tear film, trace Punctate epithelial erosions Mild arcus, Debris in tear film, trace Punctate epithelial erosions   Anterior Chamber deep, narrow temporal angle deep, narrow temporal angle   Iris Round and dilated Round and reactive -- not dilated -- pt reports vertigo with OU dilation   Lens 3+ Nuclear sclerosis with early brunescence, 3+ Cortical cataract 2+ Nuclear sclerosis, 2+ Cortical cataract   Vitreous Vitreous syneresis, Posterior vitreous detachment Poor view due to no dilation         Fundus Exam       Right Left   Disc Compact, tilted, Pink and Sharp, large peripapillary heme with SRH from 0900-1200 -- improving no view   C/D Ratio 0.3    Macula Blunted foveal reflex, central Drusen, RPE mottling and clumping, interval increase in nasal PED, but interval decrease SRF, +SRH nasal and inferior macula -- slightly improved no view   Vessels attenuated, Tortuous no view   Periphery Attached, No RT/RD, peripheral cystoid degeneration no view            IMAGING AND PROCEDURES  Imaging and Procedures for 01/14/2021  OCT, Retina - OU - Both Eyes       Right Eye Quality was good. Central Foveal Thickness: 565. Progression has improved. Findings include no IRF, pigment epithelial detachment, retinal drusen , subretinal fluid, subretinal hyper-reflective material, abnormal foveal contour (interval improvement in SRF; PED  slightly increased in size).   Left Eye Quality  was good. Central Foveal Thickness: 273. Progression has been stable. Findings include normal foveal contour, no IRF, no SRF, retinal drusen , pigment epithelial detachment, subretinal hyper-reflective material.   Notes *Images captured and stored on drive  Diagnosis / Impression:  OD: interval improvement in SRF; PED slightly increased in size OS: Non-exu ARMD -- stable  Clinical management:  See below  Abbreviations: NFP - Normal foveal profile. CME - cystoid macular edema. PED - pigment epithelial detachment. IRF - intraretinal fluid. SRF - subretinal fluid. EZ - ellipsoid zone. ERM - epiretinal membrane. ORA - outer retinal atrophy. ORT - outer retinal tubulation. SRHM - subretinal hyper-reflective material. IRHM - intraretinal hyper-reflective material      Intravitreal Injection, Pharmacologic Agent - OD - Right Eye       Time Out 01/14/2021. 8:12 AM. Confirmed correct patient, procedure, site, and patient consented.   Anesthesia Topical anesthesia was used. Anesthetic medications included Lidocaine 2%, Proparacaine 0.5%.   Procedure Preparation included 5% betadine to ocular surface, eyelid speculum. A supplied (32g) needle was used.   Injection: 1.25 mg Bevacizumab 1.9m/0.05ml   Route: Intravitreal, Site: Right Eye   NDC: 50242-060-01, Lot:: [id number] Expiration date: 02/24/2021, Waste: 0.05 mL   Post-op Post injection exam found visual acuity of at least counting fingers. The patient tolerated the procedure well. There were no complications. The patient received written and verbal post procedure care education. Post injection medications were not given.                ASSESSMENT/PLAN:    ICD-10-CM   1. Exudative age-related macular degeneration of right eye with active choroidal neovascularization (HCC)  H35.3211 Intravitreal Injection, Pharmacologic Agent - OD - Right Eye    Bevacizumab (AVASTIN) SOLN 1.25 mg     2. Intermediate stage nonexudative age-related macular degeneration of left eye  H35.3122     3. Retinal edema  H35.81 OCT, Retina - OU - Both Eyes    4. Essential hypertension  I10     5. Hypertensive retinopathy of both eyes  H35.033     6. Combined forms of age-related cataract of both eyes  H25.813      1,2. Exudative age related macular degeneration, right eye   - delayed to follow up from 4 weeks to 6 months -- did not return due to pain after last injection (12.14.21)  - pt initially reported brown spot in vision at 5 oclock visual field  - IVA OD #1 (09.14.21), #2 (10.12.21), #3 (11.09.21), #4 (12.14.21), #5 (06.20.22)  - peripapillary CNV off ST disc  - OCT w/ interval improvement in SRF; PED slightly increased in size  - BCVA decreased to 20/40 from 20/30 OD             - Recommend IVA OD #6 today, 07.19.22  - pt wishes to be treated with IVA  - RBA of procedure discussed, questions answered   - informed consent obtained and signed  - see procedure note  - f/u in 4 wks, DFE, OCT, possible injection  3. Age related macular degeneration, non-exudative, left eye  - The incidence, anatomy, and pathology of dry AMD, risk of progression, and the AREDS and AREDS 2 study including smoking risks discussed with patient.  - Recommend amsler grid monitoring  4,5. Hypertensive retinopathy OU - discussed importance of tight BP control - monitor  6. Mixed Cataract OU - The symptoms of cataract, surgical options, and treatments and risks were discussed with patient. - discussed diagnosis and  progression - approaching visual significance  Ophthalmic Meds Ordered this visit:  Meds ordered this encounter  Medications   Bevacizumab (AVASTIN) SOLN 1.25 mg       Return in about 4 weeks (around 02/11/2021) for f/u exu ARMD OD, DFE, OCT.  There are no Patient Instructions on file for this visit.  This document serves as a record of services personally performed by Gardiner Sleeper, MD, PhD. It was created on their behalf by Estill Bakes, COT an ophthalmic technician. The creation of this record is the provider's dictation and/or activities during the visit.    Electronically signed by: Estill Bakes, COT, 7.18.22 @ 9:03 AM   This document serves as a record of services personally performed by Gardiner Sleeper, MD, PhD. It was created on their behalf by San Jetty. Owens Shark, OA an ophthalmic technician. The creation of this record is the provider's dictation and/or activities during the visit.    Electronically signed by: San Jetty. Owens Shark, [place] 07.19.2022 9:03 AM  Gardiner Sleeper, M.D., Ph.D. Diseases & Surgery of the Retina and Vitreous [facility]  I have reviewed the above documentation for accuracy and completeness, and I agree with the above. Gardiner Sleeper, M.D., Ph.D. 01/14/21 9:03 AM   Abbreviations: M myopia (nearsighted); A astigmatism; H hyperopia (farsighted); P presbyopia; Mrx spectacle prescription;  CTL contact lenses; OD right eye; OS left eye; OU both eyes  XT exotropia; ET esotropia; PEK punctate epithelial keratitis; PEE punctate epithelial erosions; DES dry eye syndrome; MGD meibomian gland dysfunction; ATs artificial tears; PFAT's preservative free artificial tears; [place]augen nuclear sclerotic cataract; PSC posterior subcapsular cataract; ERM epi-retinal membrane; PVD posterior vitreous detachment; RD retinal detachment; DM diabetes mellitus; DR diabetic retinopathy; NPDR non-proliferative diabetic retinopathy; PDR proliferative diabetic retinopathy; CSME clinically significant macular edema; DME diabetic macular edema; dbh dot blot hemorrhages; CWS cotton wool spot; POAG primary open angle glaucoma; C/D cup-to-disc ratio; HVF humphrey visual field; GVF goldmann visual field; OCT optical coherence tomography; IOP intraocular pressure; BRVO Branch retinal vein occlusion; CRVO central retinal vein occlusion; CRAO central retinal artery  occlusion; BRAO branch retinal artery occlusion; RT retinal tear; SB scleral buckle; PPV pars plana vitrectomy; VH Vitreous hemorrhage; PRP panretinal laser photocoagulation; IVK intravitreal kenalog; VMT vitreomacular traction; MH Macular hole;  NVD neovascularization of the disc; NVE neovascularization elsewhere; AREDS age related eye disease study; ARMD age related macular degeneration; POAG primary open angle glaucoma; EBMD epithelial/anterior basement membrane dystrophy; ACIOL anterior chamber intraocular lens; IOL intraocular lens; PCIOL posterior chamber intraocular lens; Phaco/IOL phacoemulsification with intraocular lens placement; [place] photorefractive keratectomy; LASIK laser assisted in situ keratomileusis; HTN hypertension; DM diabetes mellitus; COPD chronic obstructive pulmonary disease

## 2021-01-14 ENCOUNTER — Encounter (INDEPENDENT_AMBULATORY_CARE_PROVIDER_SITE_OTHER): Payer: Self-pay | Admitting: Ophthalmology

## 2021-01-14 ENCOUNTER — Ambulatory Visit (INDEPENDENT_AMBULATORY_CARE_PROVIDER_SITE_OTHER): Payer: Medicare HMO | Admitting: Ophthalmology

## 2021-01-14 ENCOUNTER — Other Ambulatory Visit: Payer: Self-pay

## 2021-01-14 DIAGNOSIS — H25813 Combined forms of age-related cataract, bilateral: Secondary | ICD-10-CM

## 2021-01-14 DIAGNOSIS — I10 Essential (primary) hypertension: Secondary | ICD-10-CM

## 2021-01-14 DIAGNOSIS — H353122 Nonexudative age-related macular degeneration, left eye, intermediate dry stage: Secondary | ICD-10-CM

## 2021-01-14 DIAGNOSIS — H35033 Hypertensive retinopathy, bilateral: Secondary | ICD-10-CM | POA: Diagnosis not present

## 2021-01-14 DIAGNOSIS — H3581 Retinal edema: Secondary | ICD-10-CM

## 2021-01-14 DIAGNOSIS — H353211 Exudative age-related macular degeneration, right eye, with active choroidal neovascularization: Secondary | ICD-10-CM | POA: Diagnosis not present

## 2021-01-14 MED ORDER — BEVACIZUMAB CHEMO INJECTION 1.25MG/0.05ML SYRINGE FOR KALEIDOSCOPE
1.2500 mg | INTRAVITREAL | Status: AC | PRN
  Administered 2021-01-14: 1.25 mg via INTRAVITREAL

## 2021-02-10 NOTE — Progress Notes (Signed)
[facility] - Clinic Note  02/11/2021     CHIEF COMPLAINT Patient presents for Retina Follow Up   HISTORY OF PRESENT ILLNESS: Cassidy Miller is a 75 y.o. female who presents to the clinic today for:  HPI     Retina Follow Up   Patient presents with  Wet AMD.  In right eye.  This started weeks ago.  Severity is moderate.  Duration of weeks.  Since onset it is stable.  I, the attending physician,  performed the HPI with the patient and updated documentation appropriately.        Comments   Pt states vision is unchanged OU.  Pt has occasional sharp pain OU--not using artificial tears.  Pt denies any new or worsening floaters or fol OU.      Last edited by Rennis Chris, MD on 02/11/2021 11:12 AM.    Pt states no change in vision  Referring physician: Dimitri Ped, MD [address] [place],  [place] [address]  HISTORICAL INFORMATION:   Selected notes from the MEDICAL RECORD NUMBER Referred by Dr. Alben Spittle for eval of parapapillary CNVM OD   CURRENT MEDICATIONS: No current outpatient medications on file. (Ophthalmic Drugs)   No current facility-administered medications for this visit. (Ophthalmic Drugs)   Current Outpatient Medications (Other)  Medication Sig   Cholecalciferol (VITAMIN D) 125 MCG (5000 UT) CAPS Take 1 capsule by mouth daily.   flecainide (TAMBOCOR) 50 MG tablet Take 1 tablet (50 mg total) by mouth 2 (two) times daily.   meclizine (ANTIVERT) 12.5 MG tablet Take 12.5 mg by mouth 3 (three) times daily as needed for dizziness.   metoprolol succinate (TOPROL-XL) 25 MG 24 hr tablet Take 1 tablet (25 mg total) by mouth 2 (two) times daily.   vitamin C (ASCORBIC ACID) 500 MG tablet Take 500 mg by mouth daily.   No current facility-administered medications for this visit. (Other)   REVIEW OF SYSTEMS: ROS   Positive for: Musculoskeletal, Cardiovascular, Eyes Negative for: Constitutional, Gastrointestinal, Neurological, Skin,  Genitourinary, HENT, Endocrine, Respiratory, Psychiatric, Allergic/Imm, Heme/Lymph Last edited by Corrinne Eagle on 02/11/2021  7:47 AM.     ALLERGIES Allergies  Allergen Reactions   Peanut-Containing Drug Products     Lips tingle   Penicillins    Asa [Aspirin] Swelling    Throat,eyes   Bactrim [Sulfamethoxazole-Trimethoprim] Swelling    face   Biaxin [Clarithromycin] Swelling    face   Cortisone Swelling    face   Erythromycin Other (See Comments)    Sores on face   Tylenol [Acetaminophen] Swelling    face   PAST MEDICAL HISTORY Past Medical History:  Diagnosis Date   Arrhythmia    afib   Atrial fibrillation (HCC)    Cataract    Mixed form OU   Hypertensive retinopathy    OU   Macular degeneration    OU   Meniere disease    Raynaud disease    Ulcerative colitis (HCC)    Past Surgical History:  Procedure Laterality Date   TONSILLECTOMY AND ADENOIDECTOMY     FAMILY HISTORY Family History  Problem Relation Age of Onset   Aortic aneurysm Mother    Lung disease Father    Aortic aneurysm Maternal Aunt    Coronary artery disease Paternal Grandmother 73       died   Heart attack Paternal Grandmother    SOCIAL HISTORY Social History   Tobacco Use   Smoking status: Former  Packs/day: 1.00    Years: 10.00    Pack years: 10.00    Types: Cigarettes    Start date: 06/30/1959    Quit date: 06/29/1969    Years since quitting: 51.6   Smokeless tobacco: Never  Vaping Use   Vaping Use: Never used  Substance Use Topics   Alcohol use: No    Alcohol/week: 0.0 standard drinks   Drug use: No         OPHTHALMIC EXAM:  Base Eye Exam     Visual Acuity (Snellen - Linear)       Right Left   Dist [place] 20/70 +1 20/40 +2   Dist ph [place] 20/50 +2 20/25 -2         Tonometry (Tonopen, 7:51 AM)       Right Left   Pressure 17 16         Pupils       Dark Light Shape React APD   Right 2 1 Round Minimal 0   Left 2 1 Round Minimal 0         Visual Fields        Left Right    Full Full         Extraocular Movement       Right Left    Full Full         Neuro/Psych     Oriented x3: Yes   Mood/Affect: Normal         Dilation     Right eye: 1.0% Mydriacyl, 2.5% Phenylephrine @ 7:51 AM           Slit Lamp and Fundus Exam     Slit Lamp Exam       Right Left   Lids/Lashes Dermatochalasis - upper lid, mild MGD Dermatochalasis - upper lid   Conjunctiva/Sclera White and quiet White and quiet   Cornea Mild arcus, Debris in tear film, trace Punctate epithelial erosions Mild arcus, Debris in tear film, trace Punctate epithelial erosions   Anterior Chamber deep, narrow temporal angle deep, narrow temporal angle   Iris Round and dilated Round and reactive -- not dilated -- pt reports vertigo with OU dilation   Lens 3+ Nuclear sclerosis with early brunescence, 3+ Cortical cataract 2+ Nuclear sclerosis, 2+ Cortical cataract   Vitreous Vitreous syneresis, Posterior vitreous detachment Poor view due to no dilation         Fundus Exam       Right Left   Disc Compact, tilted, Pink and Sharp, large peripapillary heme with SRH from 0900 -- improving no details -- perfused   C/D Ratio 0.3    Macula Blunted foveal reflex, central Drusen, RPE mottling and clumping, persistent bullous nasal PED, but interval decrease SRF, +SRH nasal and inferior macula -- slightly improved grossly attached   Vessels attenuated, Tortuous attenuated   Periphery Attached, No RT/RD, peripheral cystoid degeneration no view           IMAGING AND PROCEDURES  Imaging and Procedures for 02/11/2021  OCT, Retina - OU - Both Eyes       Right Eye Quality was good. Central Foveal Thickness: 545. Progression has improved. Findings include no IRF, pigment epithelial detachment, retinal drusen , subretinal fluid, subretinal hyper-reflective material, abnormal foveal contour, intraretinal hyper-reflective material (Persistent bullous PED with mild interval  improvement in SRF overlying PED ).   Left Eye Quality was good. Central Foveal Thickness: 269. Progression has been stable. Findings include normal foveal contour, no  IRF, no SRF, retinal drusen , pigment epithelial detachment, subretinal hyper-reflective material, outer retinal atrophy.   Notes *Images captured and stored on drive  Diagnosis / Impression:  OD: Persistent bullous PED with mild interval improvement in SRF overlying PED  OS: Non-exu ARMD -- stable  Clinical management:  See below  Abbreviations: NFP - Normal foveal profile. CME - cystoid macular edema. PED - pigment epithelial detachment. IRF - intraretinal fluid. SRF - subretinal fluid. EZ - ellipsoid zone. ERM - epiretinal membrane. ORA - outer retinal atrophy. ORT - outer retinal tubulation. SRHM - subretinal hyper-reflective material. IRHM - intraretinal hyper-reflective material      Intravitreal Injection, Pharmacologic Agent - OD - Right Eye       Time Out 02/11/2021. 8:21 AM. Confirmed correct patient, procedure, site, and patient consented.   Anesthesia Topical anesthesia was used. Anesthetic medications included Lidocaine 2%, Proparacaine 0.5%.   Procedure Preparation included 5% betadine to ocular surface, eyelid speculum. A supplied (32g) needle was used.   Injection: 1.25 mg Bevacizumab 1.25mg /0.8405ml   Route: Intravitreal, Site: Right Eye   NDC: [id number], Lot: [id number], Expiration date: 03/18/2021, Waste: 0.05 mL   Post-op Post injection exam found visual acuity of at least counting fingers. The patient tolerated the procedure well. There were no complications. The patient received written and verbal post procedure care education. Post injection medications were not given.            ASSESSMENT/PLAN:    ICD-10-CM   1. Exudative age-related macular degeneration of right eye with active choroidal neovascularization (HCC)  H35.3211 Intravitreal Injection, Pharmacologic Agent - OD - Right Eye     Bevacizumab (AVASTIN) SOLN 1.25 mg    2. Retinal edema  H35.81 OCT, Retina - OU - Both Eyes    3. Intermediate stage nonexudative age-related macular degeneration of left eye  H35.3122     4. Essential hypertension  I10     5. Hypertensive retinopathy of both eyes  H35.033     6. Combined forms of age-related cataract of both eyes  H25.813       1,2. Exudative age related macular degeneration, right eye   - delayed to follow up from 4 weeks to 6 months from 12.14.21 to 06.20.22 -- did not return due to pain after last injection (12.14.21)  - pt initially reported brown spot in vision at 5 oclock visual field  - IVA OD #1 (09.14.21), #2 (10.12.21), #3 (11.09.21), #4 (12.14.21), #5 (06.20.22), #6 (07.19.22)  - peripapillary CNV off ST disc  - exam shows interval improvement in [facility] nasal and inf macula  - OCT w/ Persistent bullous PED with mild interval improvement in SRF overlying PED at 4 wks  - BCVA 20/50 OD -- stable             - Recommend IVA OD #7 today, 08.16.22 w f/u in 4 wks  - pt wishes to be treated with IVA  - RBA of procedure discussed, questions answered   - informed consent obtained and signed  - see procedure note  - f/u in 4 wks, DFE, OCT, possible injection  3. Age related macular degeneration, non-exudative, left eye  - The incidence, anatomy, and pathology of dry AMD, risk of progression, and the AREDS and AREDS 2 study including smoking risks discussed with patient.  - Recommend amsler grid monitoring  4,5. Hypertensive retinopathy OU - discussed importance of tight BP control - monitor  6. Mixed Cataract OU - The symptoms of cataract, surgical  options, and treatments and risks were discussed with patient. - discussed diagnosis and progression - approaching visual significance  Ophthalmic Meds Ordered this visit:  Meds ordered this encounter  Medications   Bevacizumab (AVASTIN) SOLN 1.25 mg       Return in about 4 weeks (around 03/11/2021) for  f/u exu ARMD OD, DFE, OCT.  There are no Patient Instructions on file for this visit.  This document serves as a record of services personally performed by Karie Chimera, MD, PhD. It was created on their behalf by Cristopher Estimable, COT an ophthalmic technician. The creation of this record is the provider's dictation and/or activities during the visit.    Electronically signed by: Cristopher Estimable, COT 8.15.22 @ 11:17 AM   This document serves as a record of services personally performed by Karie Chimera, MD, PhD. It was created on their behalf by Glee Arvin. Manson Passey, OA an ophthalmic technician. The creation of this record is the provider's dictation and/or activities during the visit.    Electronically signed by: Glee Arvin. Kristopher Oppenheim 08.16.2022 11:17 AM   Karie Chimera, M.D., Ph.D. Diseases & Surgery of the Retina and Vitreous [facility]  I have reviewed the above documentation for accuracy and completeness, and I agree with the above. Karie Chimera, M.D., Ph.D. 02/11/21 11:17 AM   Abbreviations: M myopia (nearsighted); A astigmatism; H hyperopia (farsighted); P presbyopia; Mrx spectacle prescription;  CTL contact lenses; OD right eye; OS left eye; OU both eyes  XT exotropia; ET esotropia; PEK punctate epithelial keratitis; PEE punctate epithelial erosions; DES dry eye syndrome; MGD meibomian gland dysfunction; ATs artificial tears; PFAT's preservative free artificial tears; NSC nuclear sclerotic cataract; PSC posterior subcapsular cataract; ERM epi-retinal membrane; PVD posterior vitreous detachment; RD retinal detachment; DM diabetes mellitus; DR diabetic retinopathy; NPDR non-proliferative diabetic retinopathy; PDR proliferative diabetic retinopathy; CSME clinically significant macular edema; DME diabetic macular edema; dbh dot blot hemorrhages; CWS cotton wool spot; POAG primary open angle glaucoma; C/D cup-to-disc ratio; HVF humphrey visual field; GVF goldmann visual  field; OCT optical coherence tomography; IOP intraocular pressure; BRVO Branch retinal vein occlusion; CRVO central retinal vein occlusion; CRAO central retinal artery occlusion; BRAO branch retinal artery occlusion; RT retinal tear; SB scleral buckle; PPV pars plana vitrectomy; VH Vitreous hemorrhage; PRP panretinal laser photocoagulation; IVK intravitreal kenalog; VMT vitreomacular traction; MH Macular hole;  NVD neovascularization of the disc; NVE neovascularization elsewhere; AREDS age related eye disease study; ARMD age related macular degeneration; POAG primary open angle glaucoma; EBMD epithelial/anterior basement membrane dystrophy; ACIOL anterior chamber intraocular lens; IOL intraocular lens; PCIOL posterior chamber intraocular lens; Phaco/IOL phacoemulsification with intraocular lens placement; PRK photorefractive keratectomy; LASIK laser assisted in situ keratomileusis; HTN hypertension; DM diabetes mellitus; COPD chronic obstructive pulmonary disease

## 2021-02-11 ENCOUNTER — Encounter (INDEPENDENT_AMBULATORY_CARE_PROVIDER_SITE_OTHER): Payer: Self-pay | Admitting: Ophthalmology

## 2021-02-11 ENCOUNTER — Other Ambulatory Visit: Payer: Self-pay

## 2021-02-11 ENCOUNTER — Ambulatory Visit (INDEPENDENT_AMBULATORY_CARE_PROVIDER_SITE_OTHER): Payer: Medicare HMO | Admitting: Ophthalmology

## 2021-02-11 DIAGNOSIS — I10 Essential (primary) hypertension: Secondary | ICD-10-CM | POA: Diagnosis not present

## 2021-02-11 DIAGNOSIS — H35033 Hypertensive retinopathy, bilateral: Secondary | ICD-10-CM

## 2021-02-11 DIAGNOSIS — H25813 Combined forms of age-related cataract, bilateral: Secondary | ICD-10-CM | POA: Diagnosis not present

## 2021-02-11 DIAGNOSIS — H353122 Nonexudative age-related macular degeneration, left eye, intermediate dry stage: Secondary | ICD-10-CM

## 2021-02-11 DIAGNOSIS — H3581 Retinal edema: Secondary | ICD-10-CM

## 2021-02-11 DIAGNOSIS — H353211 Exudative age-related macular degeneration, right eye, with active choroidal neovascularization: Secondary | ICD-10-CM | POA: Diagnosis not present

## 2021-02-11 MED ORDER — BEVACIZUMAB CHEMO INJECTION 1.25MG/0.05ML SYRINGE FOR KALEIDOSCOPE
1.2500 mg | INTRAVITREAL | Status: AC | PRN
  Administered 2021-02-11: 1.25 mg via INTRAVITREAL

## 2021-03-11 ENCOUNTER — Encounter (INDEPENDENT_AMBULATORY_CARE_PROVIDER_SITE_OTHER): Payer: Medicare HMO | Admitting: Ophthalmology

## 2021-03-11 DIAGNOSIS — H353122 Nonexudative age-related macular degeneration, left eye, intermediate dry stage: Secondary | ICD-10-CM

## 2021-03-11 DIAGNOSIS — H3581 Retinal edema: Secondary | ICD-10-CM

## 2021-03-11 DIAGNOSIS — I10 Essential (primary) hypertension: Secondary | ICD-10-CM

## 2021-03-11 DIAGNOSIS — H35033 Hypertensive retinopathy, bilateral: Secondary | ICD-10-CM

## 2021-03-11 DIAGNOSIS — H25813 Combined forms of age-related cataract, bilateral: Secondary | ICD-10-CM

## 2021-03-11 DIAGNOSIS — H353211 Exudative age-related macular degeneration, right eye, with active choroidal neovascularization: Secondary | ICD-10-CM

## 2021-03-17 ENCOUNTER — Other Ambulatory Visit: Payer: Self-pay | Admitting: Family Medicine

## 2021-03-17 DIAGNOSIS — I480 Paroxysmal atrial fibrillation: Secondary | ICD-10-CM

## 2021-03-24 NOTE — Progress Notes (Signed)
[facility] - Clinic Note  03/25/2021     CHIEF COMPLAINT Patient presents for Retina Follow Up  HISTORY OF PRESENT ILLNESS: Cassidy Miller is a 75 y.o. female who presents to the clinic today for:  HPI     Retina Follow Up   Patient presents with  Wet AMD.  In right eye.  This started 4 weeks ago.  I, the attending physician,  performed the HPI with the patient and updated documentation appropriately.        Comments   Patient here for 4 weeks retina follow up for exu ARMD OD. Patient states vision about the same. Doesn't see any difference. On occasion has eye pain.       Last edited by Rennis Chris, MD on 03/25/2021 12:02 PM.       Referring physician: Dimitri Ped, MD [address] [place],  [place] [address]  HISTORICAL INFORMATION:   Selected notes from the MEDICAL RECORD NUMBER Referred by Dr. Alben Spittle for eval of parapapillary CNVM OD   CURRENT MEDICATIONS: No current outpatient medications on file. (Ophthalmic Drugs)   No current facility-administered medications for this visit. (Ophthalmic Drugs)   Current Outpatient Medications (Other)  Medication Sig   Cholecalciferol (VITAMIN D) 125 MCG (5000 UT) CAPS Take 1 capsule by mouth daily.   flecainide (TAMBOCOR) 50 MG tablet TAKE ONE (1) TABLET BY MOUTH TWO (2) TIMES DAILY   meclizine (ANTIVERT) 12.5 MG tablet Take 12.5 mg by mouth 3 (three) times daily as needed for dizziness.   metoprolol succinate (TOPROL-XL) 25 MG 24 hr tablet TAKE ONE (1) TABLET BY MOUTH TWO (2) TIMES DAILY   vitamin C (ASCORBIC ACID) 500 MG tablet Take 500 mg by mouth daily.   No current facility-administered medications for this visit. (Other)   REVIEW OF SYSTEMS: ROS   Positive for: Musculoskeletal, Cardiovascular, Eyes Negative for: Constitutional, Gastrointestinal, Neurological, Skin, Genitourinary, HENT, Endocrine, Respiratory, Psychiatric, Allergic/Imm, Heme/Lymph Last edited by Laddie Aquas,  COA on 03/25/2021  7:41 AM.     ALLERGIES Allergies  Allergen Reactions   Peanut-Containing Drug Products     Lips tingle   Penicillins    Asa [Aspirin] Swelling    Throat,eyes   Bactrim [Sulfamethoxazole-Trimethoprim] Swelling    face   Biaxin [Clarithromycin] Swelling    face   Cortisone Swelling    face   Erythromycin Other (See Comments)    Sores on face   Tylenol [Acetaminophen] Swelling    face   PAST MEDICAL HISTORY Past Medical History:  Diagnosis Date   Arrhythmia    afib   Atrial fibrillation (HCC)    Cataract    Mixed form OU   Hypertensive retinopathy    OU   Macular degeneration    OU   Meniere disease    Raynaud disease    Ulcerative colitis (HCC)    Past Surgical History:  Procedure Laterality Date   TONSILLECTOMY AND ADENOIDECTOMY     FAMILY HISTORY Family History  Problem Relation Age of Onset   Aortic aneurysm Mother    Lung disease Father    Aortic aneurysm Maternal Aunt    Coronary artery disease Paternal Grandmother 36       died   Heart attack Paternal Grandmother    SOCIAL HISTORY Social History   Tobacco Use   Smoking status: Former    Packs/day: 1.00    Years: 10.00    Pack years: 10.00    Types:  Cigarettes    Start date: 06/30/1959    Quit date: 06/29/1969    Years since quitting: 51.7   Smokeless tobacco: Never  Vaping Use   Vaping Use: Never used  Substance Use Topics   Alcohol use: No    Alcohol/week: 0.0 standard drinks   Drug use: No       OPHTHALMIC EXAM:  Base Eye Exam     Visual Acuity (Snellen - Linear)       Right Left   Dist [place] 20/70 -1 20/40   Dist ph [place] 20/40 20/25 +2         Tonometry (Tonopen, 7:39 AM)       Right Left   Pressure 15 15         Pupils       Dark Light Shape React APD   Right 2 1 Round Minimal None   Left 2 1 Round Minimal None         Visual Fields (Counting fingers)       Left Right    Full Full         Extraocular Movement       Right Left    Full  Full         Neuro/Psych     Oriented x3: Yes   Mood/Affect: Normal         Dilation     Right eye: 1.0% Mydriacyl, 2.5% Phenylephrine @ 7:38 AM  Patient only wanted OD dilated.          Slit Lamp and Fundus Exam     Slit Lamp Exam       Right Left   Lids/Lashes Dermatochalasis - upper lid, mild MGD Dermatochalasis - upper lid   Conjunctiva/Sclera White and quiet White and quiet   Cornea Mild arcus, Debris in tear film, trace Punctate epithelial erosions Mild arcus, Debris in tear film, trace Punctate epithelial erosions   Anterior Chamber deep, narrow temporal angle deep, narrow temporal angle   Iris Round and dilated Round and reactive -- not dilated -- pt reports vertigo with OU dilation   Lens 3+ Nuclear sclerosis with early brunescence, 3+ Cortical cataract 2+ Nuclear sclerosis, 2+ Cortical cataract   Vitreous Vitreous syneresis, Posterior vitreous detachment Poor view due to no dilation         Fundus Exam       Right Left   Disc Compact, tilted, Pink and Sharp, large peripapillary heme with SRH from 0900 -- improving no details -- perfused   C/D Ratio 0.3    Macula Blunted foveal reflex, central Drusen, RPE mottling and clumping, interval improvement in PED, but persistent SRF, +SRH nasal and inferior macula -- improved grossly attached   Vessels attenuated, Tortuous attenuated   Periphery Attached, No RT/RD, peripheral cystoid degeneration no view           IMAGING AND PROCEDURES  Imaging and Procedures for 03/25/2021  OCT, Retina - OU - Both Eyes       Right Eye Quality was good. Central Foveal Thickness: 417. Progression has improved. Findings include no IRF, pigment epithelial detachment, retinal drusen , subretinal fluid, subretinal hyper-reflective material, abnormal foveal contour, intraretinal hyper-reflective material (interval improvement in PED height, persistent SRF greatest peripapillary).   Left Eye Quality was good. Central Foveal  Thickness: 280. Progression has been stable. Findings include normal foveal contour, no IRF, no SRF, retinal drusen , pigment epithelial detachment, subretinal hyper-reflective material, outer retinal atrophy.   Notes *Images  captured and stored on drive  Diagnosis / Impression:  OD: interval improvement in PED height, persistent SRF greatest peripapillary OS: Non-exu ARMD -- stable  Clinical management:  See below  Abbreviations: NFP - Normal foveal profile. CME - cystoid macular edema. PED - pigment epithelial detachment. IRF - intraretinal fluid. SRF - subretinal fluid. EZ - ellipsoid zone. ERM - epiretinal membrane. ORA - outer retinal atrophy. ORT - outer retinal tubulation. SRHM - subretinal hyper-reflective material. IRHM - intraretinal hyper-reflective material      Intravitreal Injection, Pharmacologic Agent - OD - Right Eye       Time Out 03/25/2021. 8:21 AM. Confirmed correct patient, procedure, site, and patient consented.   Anesthesia Topical anesthesia was used. Anesthetic medications included Lidocaine 2%, Proparacaine 0.5%.   Procedure Preparation included 5% betadine to ocular surface, eyelid speculum. A supplied needle was used.   Injection: 1.25 mg Bevacizumab 1.25mg /0.31ml   Route: Intravitreal, Site: Right Eye   NDC: 40102-725-36, Lot: [id number] , Expiration date: 05/14/2021, Waste: 0 mL   Post-op Post injection exam found visual acuity of at least counting fingers. The patient tolerated the procedure well. There were no complications. The patient received written and verbal post procedure care education. Post injection medications were not given.            ASSESSMENT/PLAN:    ICD-10-CM   1. Exudative age-related macular degeneration of right eye with active choroidal neovascularization (HCC)  H35.3211 Intravitreal Injection, Pharmacologic Agent - OD - Right Eye    Bevacizumab (AVASTIN) SOLN 1.25 mg    2. Retinal edema  H35.81 OCT, Retina - OU -  Both Eyes    3. Intermediate stage nonexudative age-related macular degeneration of left eye  H35.3122     4. Essential hypertension  I10     5. Hypertensive retinopathy of both eyes  H35.033     6. Combined forms of age-related cataract of both eyes  H25.813     1,2. Exudative age related macular degeneration, right eye   - delayed to follow up from 4 weeks to 6 months from 12.14.21 to 06.20.22 -- did not return due to pain after last injection (12.14.21)  - pt initially reported brown spot in vision at 5 oclock visual field  - IVA OD #1 (09.14.21), #2 (10.12.21), #3 (11.09.21), #4 (12.14.21), #5 (06.20.22), #6 (07.19.22), #7 (08.16.22)  - peripapillary CNV off ST disc  - exam shows interval improvement in [facility] nasal and inf macula  - OCT w/ interval improvement in PED height, persistent SRF greatest peripapillary at 4 wks  - BCVA 20/40 from 20/50 OD -- improved             - Recommend IVA OD #8 today, 09.27.22 w f/u in 6 wks, per pt requeset  - pt wishes to be treated with IVA  - RBA of procedure discussed, questions answered   - informed consent obtained and signed  - see procedure note  - f/u in 6 wks, DFE, OCT, possible injection  3. Age related macular degeneration, non-exudative, left eye  - The incidence, anatomy, and pathology of dry AMD, risk of progression, and the AREDS and AREDS 2 study including smoking risks discussed with patient.  - Recommend amsler grid monitoring  4,5. Hypertensive retinopathy OU - discussed importance of tight BP control - monitor  6. Mixed Cataract OU - The symptoms of cataract, surgical options, and treatments and risks were discussed with patient. - discussed diagnosis and progression - approaching visual significance  Ophthalmic  Meds Ordered this visit:  Meds ordered this encounter  Medications   Bevacizumab (AVASTIN) SOLN 1.25 mg      Return in about 6 weeks (around 05/06/2021) for f/u exu ARMD OD, DFE, OCT.  There are no Patient  Instructions on file for this visit.  This document serves as a record of services personally performed by Karie Chimera, MD, PhD. It was created on their behalf by Cristopher Estimable, COT an ophthalmic technician. The creation of this record is the provider's dictation and/or activities during the visit.    Electronically signed by: Cristopher Estimable, COT 9.26.22@ 12:05 PM   Karie Chimera, M.D., Ph.D. Diseases & Surgery of the Retina and Vitreous [facility]  I have reviewed the above documentation for accuracy and completeness, and I agree with the above. Karie Chimera, M.D., Ph.D. 03/25/21 12:05 PM  Abbreviations: M myopia (nearsighted); A astigmatism; H hyperopia (farsighted); P presbyopia; Mrx spectacle prescription;  CTL contact lenses; OD right eye; OS left eye; OU both eyes  XT exotropia; ET esotropia; PEK punctate epithelial keratitis; PEE punctate epithelial erosions; DES dry eye syndrome; MGD meibomian gland dysfunction; ATs artificial tears; PFAT's preservative free artificial tears; NSC nuclear sclerotic cataract; PSC posterior subcapsular cataract; ERM epi-retinal membrane; PVD posterior vitreous detachment; RD retinal detachment; DM diabetes mellitus; DR diabetic retinopathy; NPDR non-proliferative diabetic retinopathy; PDR proliferative diabetic retinopathy; CSME clinically significant macular edema; DME diabetic macular edema; dbh dot blot hemorrhages; CWS cotton wool spot; POAG primary open angle glaucoma; C/D cup-to-disc ratio; HVF humphrey visual field; GVF goldmann visual field; OCT optical coherence tomography; IOP intraocular pressure; BRVO Branch retinal vein occlusion; CRVO central retinal vein occlusion; CRAO central retinal artery occlusion; BRAO branch retinal artery occlusion; RT retinal tear; SB scleral buckle; PPV pars plana vitrectomy; VH Vitreous hemorrhage; PRP panretinal laser photocoagulation; IVK intravitreal kenalog; VMT vitreomacular traction;  MH Macular hole;  NVD neovascularization of the disc; NVE neovascularization elsewhere; AREDS age related eye disease study; ARMD age related macular degeneration; POAG primary open angle glaucoma; EBMD epithelial/anterior basement membrane dystrophy; ACIOL anterior chamber intraocular lens; IOL intraocular lens; PCIOL posterior chamber intraocular lens; Phaco/IOL phacoemulsification with intraocular lens placement; PRK photorefractive keratectomy; LASIK laser assisted in situ keratomileusis; HTN hypertension; DM diabetes mellitus; COPD chronic obstructive pulmonary disease

## 2021-03-25 ENCOUNTER — Encounter (INDEPENDENT_AMBULATORY_CARE_PROVIDER_SITE_OTHER): Payer: Self-pay | Admitting: Ophthalmology

## 2021-03-25 ENCOUNTER — Ambulatory Visit (INDEPENDENT_AMBULATORY_CARE_PROVIDER_SITE_OTHER): Payer: Medicare HMO | Admitting: Ophthalmology

## 2021-03-25 ENCOUNTER — Other Ambulatory Visit: Payer: Self-pay

## 2021-03-25 DIAGNOSIS — H353211 Exudative age-related macular degeneration, right eye, with active choroidal neovascularization: Secondary | ICD-10-CM

## 2021-03-25 DIAGNOSIS — H35033 Hypertensive retinopathy, bilateral: Secondary | ICD-10-CM

## 2021-03-25 DIAGNOSIS — H25813 Combined forms of age-related cataract, bilateral: Secondary | ICD-10-CM | POA: Diagnosis not present

## 2021-03-25 DIAGNOSIS — H353122 Nonexudative age-related macular degeneration, left eye, intermediate dry stage: Secondary | ICD-10-CM

## 2021-03-25 DIAGNOSIS — I10 Essential (primary) hypertension: Secondary | ICD-10-CM

## 2021-03-25 DIAGNOSIS — H3581 Retinal edema: Secondary | ICD-10-CM

## 2021-03-25 MED ORDER — BEVACIZUMAB CHEMO INJECTION 1.25MG/0.05ML SYRINGE FOR KALEIDOSCOPE
1.2500 mg | INTRAVITREAL | Status: AC | PRN
  Administered 2021-03-25: 1.25 mg via INTRAVITREAL

## 2021-04-29 NOTE — Progress Notes (Addendum)
[facility] Note  05/06/2021     CHIEF COMPLAINT Patient presents for Retina Follow Up  HISTORY OF PRESENT ILLNESS: Cassidy Miller is a 75 y.o. female who presents to the clinic today for:  HPI     Retina Follow Up   Patient presents with  Wet AMD.  In right eye.  Severity is moderate.  Duration of 6 weeks.  Since onset it is stable.  I, the attending physician,  performed the HPI with the patient and updated documentation appropriately.        Comments   Pt here for 6 wk ret f/u for exu ARMD OD. Pt states no vision changes since previous visit.        Last edited by Bernarda Caffey, MD on 05/06/2021  8:40 AM.    Pt states vision is stable  Referring physician: Monna Fam, MD [place],  [place] [id number]  HISTORICAL INFORMATION:   Selected notes from the MEDICAL RECORD NUMBER Referred by Dr. Kathlen Mody for eval of parapapillary CNVM OD   CURRENT MEDICATIONS: No current outpatient medications on file. (Ophthalmic Drugs)   No current facility-administered medications for this visit. (Ophthalmic Drugs)   Current Outpatient Medications (Other)  Medication Sig   Cholecalciferol (VITAMIN D) 125 MCG (5000 UT) CAPS Take 1 capsule by mouth daily.   flecainide (TAMBOCOR) 50 MG tablet TAKE ONE (1) TABLET BY MOUTH TWO (2) TIMES DAILY   meclizine (ANTIVERT) 12.5 MG tablet Take 12.5 mg by mouth 3 (three) times daily as needed for dizziness.   metoprolol succinate (TOPROL-XL) 25 MG 24 hr tablet TAKE ONE (1) TABLET BY MOUTH TWO (2) TIMES DAILY   vitamin C (ASCORBIC ACID) 500 MG tablet Take 500 mg by mouth daily.   No current facility-administered medications for this visit. (Other)   REVIEW OF SYSTEMS: ROS   Positive for: Musculoskeletal, Cardiovascular, Eyes Negative for: Constitutional, Gastrointestinal, Neurological, Skin, Genitourinary, HENT, Endocrine, Respiratory, Psychiatric, Allergic/Imm, Heme/Lymph Last edited by Kingsley Spittle, COT on 05/06/2021  7:49 AM.      ALLERGIES Allergies  Allergen Reactions   Peanut-Containing Drug Products     Lips tingle   Penicillins    Asa [Aspirin] Swelling    Throat,eyes   Bactrim [Sulfamethoxazole-Trimethoprim] Swelling    face   Biaxin [Clarithromycin] Swelling    face   Cortisone Swelling    face   Erythromycin Other (See Comments)    Sores on face   Tylenol [Acetaminophen] Swelling    face   PAST MEDICAL HISTORY Past Medical History:  Diagnosis Date   Arrhythmia    afib   Atrial fibrillation ([place])    Cataract    Mixed form OU   Hypertensive retinopathy    OU   Macular degeneration    OU   Meniere disease    Raynaud disease    Ulcerative colitis ([place])    Past Surgical History:  Procedure Laterality Date   TONSILLECTOMY AND ADENOIDECTOMY     FAMILY HISTORY Family History  Problem Relation Age of Onset   Aortic aneurysm Mother    Lung disease Father    Aortic aneurysm Maternal Aunt    Coronary artery disease Paternal Grandmother 76       died   Heart attack Paternal Grandmother    SOCIAL HISTORY Social History   Tobacco Use   Smoking status: Former    Packs/day: 1.00    Years: 10.00    Pack years:  10.00    Types: Cigarettes    Start date: 06/30/1959    Quit date: 06/29/1969    Years since quitting: 51.8   Smokeless tobacco: Never  Vaping Use   Vaping Use: Never used  Substance Use Topics   Alcohol use: No    Alcohol/week: 0.0 standard drinks   Drug use: No       OPHTHALMIC EXAM:  Base Eye Exam     Visual Acuity (Snellen - Linear)       Right Left   Dist [place] 20/60 -2 20/60   Dist ph [place] 20/50 +2 20/25         Tonometry (Tonopen, 7:58 AM)       Right Left   Pressure 16 15         Pupils       Dark Light Shape React APD   Right 2 1 Round Minimal None   Left 2 1 Round Minimal None         Visual Fields (Counting fingers)       Left Right    Full Full         Extraocular Movement       Right  Left    Full, Ortho Full, Ortho         Neuro/Psych     Oriented x3: Yes   Mood/Affect: Normal         Dilation     Right eye: 1.0% Mydriacyl, 2.5% Phenylephrine @ 7:59 AM           Slit Lamp and Fundus Exam     Slit Lamp Exam       Right Left   Lids/Lashes Dermatochalasis - upper lid, mild MGD Dermatochalasis - upper lid   Conjunctiva/Sclera White and quiet White and quiet   Cornea Mild arcus, Debris in tear film, trace Punctate epithelial erosions Mild arcus, Debris in tear film, trace Punctate epithelial erosions   Anterior Chamber deep, narrow temporal angle deep, narrow temporal angle   Iris Round and dilated Round and reactive -- not dilated -- pt reports vertigo with OU dilation   Lens 3+ Nuclear sclerosis with early brunescence, 3+ Cortical cataract 2+ Nuclear sclerosis, 2+ Cortical cataract   Vitreous Vitreous syneresis, Posterior vitreous detachment Poor view due to no dilation         Fundus Exam       Right Left   Disc Compact, tilted, Pink and Sharp, peripapillary heme with SRH at 0900 -- improving no details -- perfused   C/D Ratio 0.3    Macula Blunted foveal reflex, central Drusen, RPE mottling and clumping, persistent large central PED with overlying SRF improving, +SRH nasal and inferior macula -- improved grossly attached   Vessels attenuated, Tortuous attenuated   Periphery Attached, No RT/RD, peripheral cystoid degeneration no view           IMAGING AND PROCEDURES  Imaging and Procedures for 05/06/2021  OCT, Retina - OU - Both Eyes       Right Eye Quality was good. Central Foveal Thickness: 269. Progression has improved. Findings include no IRF, pigment epithelial detachment, retinal drusen , subretinal fluid, subretinal hyper-reflective material, abnormal foveal contour, intraretinal hyper-reflective material (interval improvement SRF overlying stable PED).   Left Eye Quality was good. Central Foveal Thickness: 283. Progression has  been stable. Findings include normal foveal contour, no IRF, no SRF, retinal drusen , pigment epithelial detachment, subretinal hyper-reflective material, outer retinal atrophy.   Notes *Images captured  and stored on drive  Diagnosis / Impression:  OD: interval improvement SRF overlying stable PED OS: Non-exu ARMD -- stable  Clinical management:  See below  Abbreviations: NFP - Normal foveal profile. CME - cystoid macular edema. PED - pigment epithelial detachment. IRF - intraretinal fluid. SRF - subretinal fluid. EZ - ellipsoid zone. ERM - epiretinal membrane. ORA - outer retinal atrophy. ORT - outer retinal tubulation. SRHM - subretinal hyper-reflective material. IRHM - intraretinal hyper-reflective material      Intravitreal Injection, Pharmacologic Agent - OD - Right Eye       Time Out 05/06/2021. 8:18 AM. Confirmed correct patient, procedure, site, and patient consented.   Anesthesia Topical anesthesia was used. Anesthetic medications included Lidocaine 2%, Proparacaine 0.5%.   Procedure Preparation included 5% betadine to ocular surface, eyelid speculum. A supplied needle was used.   Injection: 1.25 mg Bevacizumab 1.72m/0.05ml   Route: Intravitreal, Site: Right Eye   NDC: 50242-060-01, Lot:: [id number] Expiration date: 06/17/2021, Waste: 0.05 mL   Post-op Post injection exam found visual acuity of at least counting fingers. The patient tolerated the procedure well. There were no complications. The patient received written and verbal post procedure care education. Post injection medications were not given.            ASSESSMENT/PLAN:    ICD-10-CM   1. Exudative age-related macular degeneration of right eye with active choroidal neovascularization (HCC)  H35.3211 Intravitreal Injection, Pharmacologic Agent - OD - Right Eye    Bevacizumab (AVASTIN) SOLN 1.25 mg    2. Retinal edema  H35.81 OCT, Retina - OU - Both Eyes    3. Intermediate stage nonexudative age-related  macular degeneration of left eye  H35.3122     4. Essential hypertension  I10     5. Hypertensive retinopathy of both eyes  H35.033     6. Combined forms of age-related cataract of both eyes  H25.813      1,2. Exudative age related macular degeneration, right eye   - delayed to follow up from 4 weeks to 6 months from 12.14.21 to 06.20.22 -- did not return due to pain after last injection (12.14.21)  - pt initially reported brown spot in vision at 5 oclock visual field  - IVA OD #1 (09.14.21), #2 (10.12.21), #3 (11.09.21), #4 (12.14.21), #5 (06.20.22), #6 (07.19.22), #7 (08.16.22), #8 (9.27.22)  - peripapillary CNV off ST disc  - exam shows interval improvement in [facility] and inf macula  - OCT w/ interval improvement in SRF overlying stable PED at 6 wks  - BCVA 20/50 from 20/40 OD -- decreased             - Recommend IVA OD #9 today, 11.8.248w f/u in 6 wks  - pt wishes to be treated with IVA  - RBA of procedure discussed, questions answered   - informed consent obtained and signed  - see procedure note  - f/u in 6 wks, DFE, OCT, possible injection  3. Age related macular degeneration, non-exudative, left eye  - The incidence, anatomy, and pathology of dry AMD, risk of progression, and the AREDS and AREDS 2 study including smoking risks discussed with patient.  - Recommend amsler grid monitoring  4,5. Hypertensive retinopathy OU - discussed importance of tight BP control - monitor  6. Mixed Cataract OU - The symptoms of cataract, surgical options, and treatments and risks were discussed with patient. - discussed diagnosis and progression - [facility]- clear from  a retina standpoint to proceed with cataract surgery when pt and surgeon are ready   Ophthalmic Meds Ordered this visit:  Meds ordered this encounter  Medications   Bevacizumab (AVASTIN) SOLN 1.25 mg     Return in about 6 weeks (around 06/17/2021) for f/u exu ARMD OD, DFE,  OCT.  There are no Patient Instructions on file for this visit.  This document serves as a record of services personally performed by Gardiner Sleeper, MD, PhD. It was created on their behalf by Estill Bakes, COT an ophthalmic technician. The creation of this record is the provider's dictation and/or activities during the visit.    Electronically signed by: Estill Bakes, COT 11.1.22 @ 8:45 AM   This document serves as a record of services personally performed by Gardiner Sleeper, MD, PhD. It was created on their behalf by San Jetty. Owens Shark, OA an ophthalmic technician. The creation of this record is the provider's dictation and/or activities during the visit.    Electronically signed by: San Jetty. Owens Shark, [place] 11.08.2022 8:45 AM   Gardiner Sleeper, M.D., Ph.D. Diseases & Surgery of the Retina and Vitreous [facility]  I have reviewed the above documentation for accuracy and completeness, and I agree with the above. Gardiner Sleeper, M.D., Ph.D. 05/06/21 8:45 AM  Abbreviations: M myopia (nearsighted); A astigmatism; H hyperopia (farsighted); P presbyopia; Mrx spectacle prescription;  CTL contact lenses; OD right eye; OS left eye; OU both eyes  XT exotropia; ET esotropia; PEK punctate epithelial keratitis; PEE punctate epithelial erosions; DES dry eye syndrome; MGD meibomian gland dysfunction; ATs artificial tears; PFAT's preservative free artificial tears; [place] nuclear sclerotic cataract; PSC posterior subcapsular cataract; ERM epi-retinal membrane; PVD posterior vitreous detachment; RD retinal detachment; DM diabetes mellitus; DR diabetic retinopathy; NPDR non-proliferative diabetic retinopathy; PDR proliferative diabetic retinopathy; CSME clinically significant macular edema; DME diabetic macular edema; dbh dot blot hemorrhages; CWS cotton wool spot; POAG primary open angle glaucoma; C/D cup-to-disc ratio; HVF humphrey visual field; GVF goldmann visual field; OCT optical coherence  tomography; IOP intraocular pressure; BRVO Branch retinal vein occlusion; CRVO central retinal vein occlusion; CRAO central retinal artery occlusion; BRAO branch retinal artery occlusion; RT retinal tear; SB scleral buckle; PPV pars plana vitrectomy; VH Vitreous hemorrhage; PRP panretinal laser photocoagulation; IVK intravitreal kenalog; VMT vitreomacular traction; MH Macular hole;  NVD neovascularization of the disc; NVE neovascularization elsewhere; AREDS age related eye disease study; ARMD age related macular degeneration; POAG primary open angle glaucoma; EBMD epithelial/anterior basement membrane dystrophy; ACIOL anterior chamber intraocular lens; IOL intraocular lens; PCIOL posterior chamber intraocular lens; Phaco/IOL phacoemulsification with intraocular lens placement; [place] photorefractive keratectomy; LASIK laser assisted in situ keratomileusis; HTN hypertension; DM diabetes mellitus; COPD chronic obstructive pulmonary disease

## 2021-05-06 ENCOUNTER — Ambulatory Visit (INDEPENDENT_AMBULATORY_CARE_PROVIDER_SITE_OTHER): Payer: Medicare HMO | Admitting: Ophthalmology

## 2021-05-06 ENCOUNTER — Other Ambulatory Visit: Payer: Self-pay

## 2021-05-06 ENCOUNTER — Encounter (INDEPENDENT_AMBULATORY_CARE_PROVIDER_SITE_OTHER): Payer: Self-pay | Admitting: Ophthalmology

## 2021-05-06 DIAGNOSIS — I10 Essential (primary) hypertension: Secondary | ICD-10-CM | POA: Diagnosis not present

## 2021-05-06 DIAGNOSIS — H353211 Exudative age-related macular degeneration, right eye, with active choroidal neovascularization: Secondary | ICD-10-CM | POA: Diagnosis not present

## 2021-05-06 DIAGNOSIS — H25813 Combined forms of age-related cataract, bilateral: Secondary | ICD-10-CM

## 2021-05-06 DIAGNOSIS — H35033 Hypertensive retinopathy, bilateral: Secondary | ICD-10-CM | POA: Diagnosis not present

## 2021-05-06 DIAGNOSIS — H3581 Retinal edema: Secondary | ICD-10-CM

## 2021-05-06 DIAGNOSIS — H353122 Nonexudative age-related macular degeneration, left eye, intermediate dry stage: Secondary | ICD-10-CM

## 2021-05-06 MED ORDER — BEVACIZUMAB CHEMO INJECTION 1.25MG/0.05ML SYRINGE FOR KALEIDOSCOPE
1.2500 mg | INTRAVITREAL | Status: AC | PRN
  Administered 2021-05-06: 1.25 mg via INTRAVITREAL

## 2021-06-05 NOTE — Progress Notes (Shared)
[facility] - Clinic Note  06/17/2021     CHIEF COMPLAINT Patient presents for No chief complaint on file.  HISTORY OF PRESENT ILLNESS: Cassidy Miller is a 75 y.o. female who presents to the clinic today for:     Referring physician: Lawerance Sabal, [place] [address] [place],  [place] [address]  HISTORICAL INFORMATION:   Selected notes from the MEDICAL RECORD NUMBER Referred by Dr. Alben Spittle for eval of parapapillary CNVM OD   CURRENT MEDICATIONS: No current outpatient medications on file. (Ophthalmic Drugs)   No current facility-administered medications for this visit. (Ophthalmic Drugs)   Current Outpatient Medications (Other)  Medication Sig   Cholecalciferol (VITAMIN D) 125 MCG (5000 UT) CAPS Take 1 capsule by mouth daily.   flecainide (TAMBOCOR) 50 MG tablet TAKE ONE (1) TABLET BY MOUTH TWO (2) TIMES DAILY   meclizine (ANTIVERT) 12.5 MG tablet Take 12.5 mg by mouth 3 (three) times daily as needed for dizziness.   metoprolol succinate (TOPROL-XL) 25 MG 24 hr tablet TAKE ONE (1) TABLET BY MOUTH TWO (2) TIMES DAILY   vitamin C (ASCORBIC ACID) 500 MG tablet Take 500 mg by mouth daily.   No current facility-administered medications for this visit. (Other)   REVIEW OF SYSTEMS:    ALLERGIES Allergies  Allergen Reactions   Peanut-Containing Drug Products     Lips tingle   Penicillins    Asa [Aspirin] Swelling    Throat,eyes   Bactrim [Sulfamethoxazole-Trimethoprim] Swelling    face   Biaxin [Clarithromycin] Swelling    face   Cortisone Swelling    face   Erythromycin Other (See Comments)    Sores on face   Tylenol [Acetaminophen] Swelling    face   PAST MEDICAL HISTORY Past Medical History:  Diagnosis Date   Arrhythmia    afib   Atrial fibrillation (HCC)    Cataract    Mixed form OU   Hypertensive retinopathy    OU   Macular degeneration    OU   Meniere disease    Raynaud disease    Ulcerative colitis (HCC)    Past Surgical History:   Procedure Laterality Date   TONSILLECTOMY AND ADENOIDECTOMY     FAMILY HISTORY Family History  Problem Relation Age of Onset   Aortic aneurysm Mother    Lung disease Father    Aortic aneurysm Maternal Aunt    Coronary artery disease Paternal Grandmother 73       died   Heart attack Paternal Grandmother    SOCIAL HISTORY Social History   Tobacco Use   Smoking status: Former    Packs/day: 1.00    Years: 10.00    Pack years: 10.00    Types: Cigarettes    Start date: 06/30/1959    Quit date: 06/29/1969    Years since quitting: 51.9   Smokeless tobacco: Never  Vaping Use   Vaping Use: Never used  Substance Use Topics   Alcohol use: No    Alcohol/week: 0.0 standard drinks   Drug use: No       OPHTHALMIC EXAM:  Not recorded    IMAGING AND PROCEDURES  Imaging and Procedures for 06/17/2021          ASSESSMENT/PLAN:  No diagnosis found.  1,2. Exudative age related macular degeneration, right eye   - delayed to follow up from 4 weeks to 6 months from 12.14.21 to 06.20.22 -- did not return due to pain after last injection (12.14.21)  - pt  initially reported brown spot in vision at 5 oclock visual field  - IVA OD #1 (09.14.21), #2 (10.12.21), #3 (11.09.21), #4 (12.14.21), #5 (06.20.22), #6 (07.19.22), #7 (08.16.22), #8 (9.27.22), #9 (11.8.22)  - peripapillary CNV off ST disc  - exam shows interval improvement in [facility] nasal and inf macula  - OCT w/ interval improvement in SRF overlying stable PED at 6 wks  - BCVA 20/50 from 20/40 OD -- decreased             - Recommend IVA OD #10 today, 12.20.22 w f/u in 6 wks  - pt wishes to be treated with IVA  - RBA of procedure discussed, questions answered   - informed consent obtained and signed  - see procedure note  - f/u in 6 wks, DFE, OCT, possible injection  3. Age related macular degeneration, non-exudative, left eye  - The incidence, anatomy, and pathology of dry AMD, risk of progression, and the AREDS and AREDS 2 study  including smoking risks discussed with patient.  - Recommend amsler grid monitoring  4,5. Hypertensive retinopathy OU - discussed importance of tight BP control - monitor  6. Mixed Cataract OU - The symptoms of cataract, surgical options, and treatments and risks were discussed with patient. - discussed diagnosis and progression - under the expert management of [facility] - clear from a retina standpoint to proceed with cataract surgery when pt and surgeon are ready   Ophthalmic Meds Ordered this visit:  No orders of the defined types were placed in this encounter.    No follow-ups on file.  There are no Patient Instructions on file for this visit.  This document serves as a record of services personally performed by Gardiner Sleeper, MD, PhD. It was created on their behalf by Estill Bakes, COT an ophthalmic technician. The creation of this record is the provider's dictation and/or activities during the visit.    Electronically signed by: Estill Bakes, COT 12.8.22 @ 12:34 PM   Gardiner Sleeper, M.D., Ph.D. Diseases & Surgery of the Retina and Vitreous [facility]: M myopia (nearsighted); A astigmatism; H hyperopia (farsighted); P presbyopia; Mrx spectacle prescription;  CTL contact lenses; OD right eye; OS left eye; OU both eyes  XT exotropia; ET esotropia; PEK punctate epithelial keratitis; PEE punctate epithelial erosions; DES dry eye syndrome; MGD meibomian gland dysfunction; ATs artificial tears; PFAT's preservative free artificial tears; [place] nuclear sclerotic cataract; PSC posterior subcapsular cataract; ERM epi-retinal membrane; PVD posterior vitreous detachment; RD retinal detachment; DM diabetes mellitus; DR diabetic retinopathy; NPDR non-proliferative diabetic retinopathy; PDR proliferative diabetic retinopathy; CSME clinically significant macular edema; DME diabetic macular edema; dbh dot blot hemorrhages; CWS cotton wool spot;  POAG primary open angle glaucoma; C/D cup-to-disc ratio; HVF humphrey visual field; GVF goldmann visual field; OCT optical coherence tomography; IOP intraocular pressure; BRVO Branch retinal vein occlusion; CRVO central retinal vein occlusion; CRAO central retinal artery occlusion; BRAO branch retinal artery occlusion; RT retinal tear; SB scleral buckle; PPV pars plana vitrectomy; VH Vitreous hemorrhage; PRP panretinal laser photocoagulation; IVK intravitreal kenalog; VMT vitreomacular traction; MH Macular hole;  NVD neovascularization of the disc; NVE neovascularization elsewhere; AREDS age related eye disease study; ARMD age related macular degeneration; POAG primary open angle glaucoma; EBMD epithelial/anterior basement membrane dystrophy; ACIOL anterior chamber intraocular lens; IOL intraocular lens; PCIOL posterior chamber intraocular lens; Phaco/IOL phacoemulsification with intraocular lens placement; PRK photorefractive keratectomy; LASIK laser assisted in situ keratomileusis; HTN hypertension; DM  diabetes mellitus; COPD chronic obstructive pulmonary disease

## 2021-06-17 ENCOUNTER — Encounter (INDEPENDENT_AMBULATORY_CARE_PROVIDER_SITE_OTHER): Payer: Medicare HMO | Admitting: Ophthalmology

## 2021-06-17 DIAGNOSIS — H353122 Nonexudative age-related macular degeneration, left eye, intermediate dry stage: Secondary | ICD-10-CM

## 2021-06-17 DIAGNOSIS — I10 Essential (primary) hypertension: Secondary | ICD-10-CM

## 2021-06-17 DIAGNOSIS — H3581 Retinal edema: Secondary | ICD-10-CM

## 2021-06-17 DIAGNOSIS — H35033 Hypertensive retinopathy, bilateral: Secondary | ICD-10-CM

## 2021-06-17 DIAGNOSIS — H353211 Exudative age-related macular degeneration, right eye, with active choroidal neovascularization: Secondary | ICD-10-CM

## 2021-06-17 DIAGNOSIS — H25813 Combined forms of age-related cataract, bilateral: Secondary | ICD-10-CM

## 2021-07-02 NOTE — Progress Notes (Shared)
[facility] Note  07/08/2021     CHIEF COMPLAINT Patient presents for No chief complaint on file.  HISTORY OF PRESENT ILLNESS: Cassidy Miller is a 76 y.o. female who presents to the clinic today for:    Referring physician: Denny Levy, [place],  [place] [id number]  HISTORICAL INFORMATION:   Selected notes from the MEDICAL RECORD NUMBER Referred by Dr. Kathlen Mody for eval of parapapillary CNVM OD   CURRENT MEDICATIONS: No current outpatient medications on file. (Ophthalmic Drugs)   No current facility-administered medications for this visit. (Ophthalmic Drugs)   Current Outpatient Medications (Other)  Medication Sig   Cholecalciferol (VITAMIN D) 125 MCG (5000 UT) CAPS Take 1 capsule by mouth daily.   flecainide (TAMBOCOR) 50 MG tablet TAKE ONE (1) TABLET BY MOUTH TWO (2) TIMES DAILY   meclizine (ANTIVERT) 12.5 MG tablet Take 12.5 mg by mouth 3 (three) times daily as needed for dizziness.   metoprolol succinate (TOPROL-XL) 25 MG 24 hr tablet TAKE ONE (1) TABLET BY MOUTH TWO (2) TIMES DAILY   vitamin C (ASCORBIC ACID) 500 MG tablet Take 500 mg by mouth daily.   No current facility-administered medications for this visit. (Other)   REVIEW OF SYSTEMS:  ALLERGIES Allergies  Allergen Reactions   Peanut-Containing Drug Products     Lips tingle   Penicillins    Asa [Aspirin] Swelling    Throat,eyes   Bactrim [Sulfamethoxazole-Trimethoprim] Swelling    face   Biaxin [Clarithromycin] Swelling    face   Cortisone Swelling    face   Erythromycin Other (See Comments)    Sores on face   Tylenol [Acetaminophen] Swelling    face   PAST MEDICAL HISTORY Past Medical History:  Diagnosis Date   Arrhythmia    afib   Atrial fibrillation (HCC)    Cataract    Mixed form OU   Hypertensive retinopathy    OU   Macular degeneration    OU   Meniere disease    Raynaud disease    Ulcerative colitis ([place])    Past Surgical History:  Procedure  Laterality Date   TONSILLECTOMY AND ADENOIDECTOMY     FAMILY HISTORY Family History  Problem Relation Age of Onset   Aortic aneurysm Mother    Lung disease Father    Aortic aneurysm Maternal Aunt    Coronary artery disease Paternal Grandmother 30       died   Heart attack Paternal Grandmother    SOCIAL HISTORY Social History   Tobacco Use   Smoking status: Former    Packs/day: 1.00    Years: 10.00    Pack years: 10.00    Types: Cigarettes    Start date: 06/30/1959    Quit date: 06/29/1969    Years since quitting: 52.0   Smokeless tobacco: Never  Vaping Use   Vaping Use: Never used  Substance Use Topics   Alcohol use: No    Alcohol/week: 0.0 standard drinks   Drug use: No       OPHTHALMIC EXAM:  Not recorded    IMAGING AND PROCEDURES  Imaging and Procedures for 07/08/2021          ASSESSMENT/PLAN:  No diagnosis found.  1,2. Exudative age related macular degeneration, right eye   - delayed to follow up from 4 weeks to 6 months from 12.14.21 to 06.20.22 -- did not return due to pain after last injection (12.14.21)  - pt initially reported brown  spot in vision at 5 oclock visual field  - IVA OD #1 (09.14.21), #2 (10.12.21), #3 (11.09.21), #4 (12.14.21), #5 (06.20.22), #6 (07.19.22), #7 (08.16.22), #8 (9.27.22), #9 (11.8.22)  - peripapillary CNV off ST disc  - exam shows interval improvement in [facility] nasal and inf macula  - OCT w/ interval improvement in SRF overlying stable PED at 6 wks  - BCVA 20/50 from 20/40 OD -- decreased             - Recommend IVA OD #10 today, 1.10.23 w f/u in 6 wks  - pt wishes to be treated with IVA  - RBA of procedure discussed, questions answered   - informed consent obtained and signed  - see procedure note  - f/u in 6 wks, DFE, OCT, possible injection  3. Age related macular degeneration, non-exudative, left eye  - The incidence, anatomy, and pathology of dry AMD, risk of progression, and the AREDS and AREDS 2 study including  smoking risks discussed with patient.  - Recommend amsler grid monitoring  4,5. Hypertensive retinopathy OU - discussed importance of tight BP control - monitor  6. Mixed Cataract OU - The symptoms of cataract, surgical options, and treatments and risks were discussed with patient. - discussed diagnosis and progression - under the expert management of [facility] - clear from a retina standpoint to proceed with cataract surgery when pt and surgeon are ready  Ophthalmic Meds Ordered this visit:  No orders of the defined types were placed in this encounter.    No follow-ups on file.  There are no Patient Instructions on file for this visit.  This document serves as a record of services personally performed by Gardiner Sleeper, MD, PhD. It was created on their behalf by Estill Bakes, COT an ophthalmic technician. The creation of this record is the provider's dictation and/or activities during the visit.    Electronically signed by: Estill Bakes, COT 1.4.23 @ 4:13 PM   Gardiner Sleeper, M.D., Ph.D. Diseases & Surgery of the Retina and Vitreous [facility]: M myopia (nearsighted); A astigmatism; H hyperopia (farsighted); P presbyopia; Mrx spectacle prescription;  CTL contact lenses; OD right eye; OS left eye; OU both eyes  XT exotropia; ET esotropia; PEK punctate epithelial keratitis; PEE punctate epithelial erosions; DES dry eye syndrome; MGD meibomian gland dysfunction; ATs artificial tears; PFAT's preservative free artificial tears; [place] nuclear sclerotic cataract; PSC posterior subcapsular cataract; ERM epi-retinal membrane; PVD posterior vitreous detachment; RD retinal detachment; DM diabetes mellitus; DR diabetic retinopathy; NPDR non-proliferative diabetic retinopathy; PDR proliferative diabetic retinopathy; CSME clinically significant macular edema; DME diabetic macular edema; dbh dot blot hemorrhages; CWS cotton wool spot; POAG primary open  angle glaucoma; C/D cup-to-disc ratio; HVF humphrey visual field; GVF goldmann visual field; OCT optical coherence tomography; IOP intraocular pressure; BRVO Branch retinal vein occlusion; CRVO central retinal vein occlusion; CRAO central retinal artery occlusion; BRAO branch retinal artery occlusion; RT retinal tear; SB scleral buckle; PPV pars plana vitrectomy; VH Vitreous hemorrhage; PRP panretinal laser photocoagulation; IVK intravitreal kenalog; VMT vitreomacular traction; MH Macular hole;  NVD neovascularization of the disc; NVE neovascularization elsewhere; AREDS age related eye disease study; ARMD age related macular degeneration; POAG primary open angle glaucoma; EBMD epithelial/anterior basement membrane dystrophy; ACIOL anterior chamber intraocular lens; IOL intraocular lens; PCIOL posterior chamber intraocular lens; Phaco/IOL phacoemulsification with intraocular lens placement; [place] photorefractive keratectomy; LASIK laser assisted in situ keratomileusis; HTN hypertension; DM diabetes mellitus; COPD chronic obstructive  pulmonary disease

## 2021-07-08 ENCOUNTER — Encounter (INDEPENDENT_AMBULATORY_CARE_PROVIDER_SITE_OTHER): Payer: Medicare HMO | Admitting: Ophthalmology

## 2021-07-08 DIAGNOSIS — H353122 Nonexudative age-related macular degeneration, left eye, intermediate dry stage: Secondary | ICD-10-CM

## 2021-07-08 DIAGNOSIS — F419 Anxiety disorder, unspecified: Secondary | ICD-10-CM | POA: Diagnosis not present

## 2021-07-08 DIAGNOSIS — M549 Dorsalgia, unspecified: Secondary | ICD-10-CM | POA: Diagnosis not present

## 2021-07-08 DIAGNOSIS — I10 Essential (primary) hypertension: Secondary | ICD-10-CM

## 2021-07-08 DIAGNOSIS — H25813 Combined forms of age-related cataract, bilateral: Secondary | ICD-10-CM

## 2021-07-08 DIAGNOSIS — R079 Chest pain, unspecified: Secondary | ICD-10-CM | POA: Diagnosis not present

## 2021-07-08 DIAGNOSIS — H35033 Hypertensive retinopathy, bilateral: Secondary | ICD-10-CM

## 2021-07-08 DIAGNOSIS — H353211 Exudative age-related macular degeneration, right eye, with active choroidal neovascularization: Secondary | ICD-10-CM

## 2021-07-08 DIAGNOSIS — Z681 Body mass index (BMI) 19 or less, adult: Secondary | ICD-10-CM | POA: Diagnosis not present

## 2021-07-22 ENCOUNTER — Other Ambulatory Visit: Payer: Self-pay

## 2021-07-22 ENCOUNTER — Ambulatory Visit (INDEPENDENT_AMBULATORY_CARE_PROVIDER_SITE_OTHER): Payer: Medicare HMO | Admitting: Ophthalmology

## 2021-07-22 ENCOUNTER — Encounter (INDEPENDENT_AMBULATORY_CARE_PROVIDER_SITE_OTHER): Payer: Self-pay | Admitting: Ophthalmology

## 2021-07-22 DIAGNOSIS — H353211 Exudative age-related macular degeneration, right eye, with active choroidal neovascularization: Secondary | ICD-10-CM | POA: Diagnosis not present

## 2021-07-22 DIAGNOSIS — H353122 Nonexudative age-related macular degeneration, left eye, intermediate dry stage: Secondary | ICD-10-CM

## 2021-07-22 DIAGNOSIS — I10 Essential (primary) hypertension: Secondary | ICD-10-CM

## 2021-07-22 DIAGNOSIS — H35033 Hypertensive retinopathy, bilateral: Secondary | ICD-10-CM | POA: Diagnosis not present

## 2021-07-22 DIAGNOSIS — H25813 Combined forms of age-related cataract, bilateral: Secondary | ICD-10-CM

## 2021-07-22 NOTE — Progress Notes (Signed)
[facility] Note  07/22/2021     CHIEF COMPLAINT Patient presents for Retina Follow Up  HISTORY OF PRESENT ILLNESS: Cassidy Miller is a 76 y.o. female who presents to the clinic today for:  HPI     Retina Follow Up   Patient presents with  Wet AMD.  In right eye.  This started years ago.  Severity is moderate.  Duration of 11 weeks.  Since onset it is stable.  I, the attending physician,  performed the HPI with the patient and updated documentation appropriately.        Comments   76 y/o female pt here for 11 wk (delayed 6 wk) f/u for exu ARMD OD.  F/u delayed due to personal, health and family issues.  Hard to say if VA OU has changed.  Denies pain, floaters.  Pt has been dealing with intermittent temporal FOL OD for a long time.  Pt has been under an immense amount of stress lately.  AT prn OU.      Last edited by Bernarda Caffey, MD on 07/23/2021  9:35 AM.    Pt is delayed to follow up from 6 weeks to 11 weeks due to family health reasons, she states her BP has been running high  Referring physician: Denny Levy, [place],  [place] [address]  HISTORICAL INFORMATION:   Selected notes from the MEDICAL RECORD NUMBER Referred by Dr. Kathlen Mody for eval of parapapillary CNVM OD   CURRENT MEDICATIONS: No current outpatient medications on file. (Ophthalmic Drugs)   No current facility-administered medications for this visit. (Ophthalmic Drugs)   Current Outpatient Medications (Other)  Medication Sig   ALPRAZolam (XANAX) 0.25 MG tablet Take 0.25 mg by mouth daily as needed.   Cholecalciferol (VITAMIN D) 125 MCG (5000 UT) CAPS Take 1 capsule by mouth daily.   flecainide (TAMBOCOR) 50 MG tablet TAKE ONE (1) TABLET BY MOUTH TWO (2) TIMES DAILY   meclizine (ANTIVERT) 12.5 MG tablet Take 12.5 mg by mouth 3 (three) times daily as needed for dizziness.   metoprolol succinate (TOPROL-XL) 25 MG 24 hr tablet TAKE ONE (1) TABLET BY MOUTH TWO (2) TIMES  DAILY   vitamin C (ASCORBIC ACID) 500 MG tablet Take 500 mg by mouth daily.   No current facility-administered medications for this visit. (Other)   REVIEW OF SYSTEMS: ROS   Positive for: Gastrointestinal, Musculoskeletal, Cardiovascular, Eyes Negative for: Constitutional, Neurological, Skin, Genitourinary, HENT, Endocrine, Respiratory, Psychiatric, Allergic/Imm, Heme/Lymph Last edited by Matthew Folks, COA on 07/22/2021  8:03 AM.     ALLERGIES Allergies  Allergen Reactions   Peanut-Containing Drug Products     Lips tingle   Penicillins    Asa [Aspirin] Swelling    Throat,eyes   Bactrim [Sulfamethoxazole-Trimethoprim] Swelling    face   Biaxin [Clarithromycin] Swelling    face   Cortisone Swelling    face   Erythromycin Other (See Comments)    Sores on face   Tylenol [Acetaminophen] Swelling    face   PAST MEDICAL HISTORY Past Medical History:  Diagnosis Date   Arrhythmia    afib   Atrial fibrillation ([place])    Cataract    Mixed form OU   Hypertensive retinopathy    OU   Macular degeneration    OU   Meniere disease    Raynaud disease    Ulcerative colitis ([place])    Past Surgical History:  Procedure Laterality Date   TONSILLECTOMY AND  ADENOIDECTOMY     FAMILY HISTORY Family History  Problem Relation Age of Onset   Aortic aneurysm Mother    Lung disease Father    Aortic aneurysm Maternal Aunt    Coronary artery disease Paternal Grandmother 59       died   Heart attack Paternal Grandmother    SOCIAL HISTORY Social History   Tobacco Use   Smoking status: Former    Packs/day: 1.00    Years: 10.00    Pack years: 10.00    Types: Cigarettes    Start date: 06/30/1959    Quit date: 06/29/1969    Years since quitting: 52.1   Smokeless tobacco: Never  Vaping Use   Vaping Use: Never used  Substance Use Topics   Alcohol use: No    Alcohol/week: 0.0 standard drinks   Drug use: No       OPHTHALMIC EXAM:  Base Eye Exam     Visual Acuity (Snellen -  Linear)       Right Left   Dist [place] 20/60 -2 20/40 -2   Dist ph [place] 20/50 -2 20/25         Tonometry (Tonopen, 8:08 AM)       Right Left   Pressure 11 12         Pupils       Dark Light Shape React APD   Right 2 1 Round Minimal None   Left 2 1 Round Minimal None         Visual Fields (Counting fingers)       Left Right    Full Full         Extraocular Movement       Right Left    Full, Ortho Full, Ortho         Neuro/Psych     Oriented x3: Yes   Mood/Affect: Normal         Dilation     Right eye: 1.0% Mydriacyl, 2.5% Phenylephrine @ 8:08 AM  Pt requests only OD be dilated, as she has vertigo, and dilating OU further complicates this.          Slit Lamp and Fundus Exam     Slit Lamp Exam       Right Left   Lids/Lashes Dermatochalasis - upper lid, mild MGD Dermatochalasis - upper lid   Conjunctiva/Sclera White and quiet White and quiet   Cornea Mild arcus, Debris in tear film, trace Punctate epithelial erosions Mild arcus, Debris in tear film, trace Punctate epithelial erosions   Anterior Chamber deep, narrow temporal angle deep, narrow temporal angle   Iris Round and dilated Round and reactive -- not dilated -- pt reports vertigo with OU dilation   Lens 3+ Nuclear sclerosis with early brunescence, 3+ Cortical cataract 2+ Nuclear sclerosis, 2+ Cortical cataract   Anterior Vitreous Vitreous syneresis, Posterior vitreous detachment Poor view due to no dilation         Fundus Exam       Right Left   Disc Compact, tilted, Pink and Sharp, peripapillary heme with SRH at 0900 -- improving no details -- perfused   C/D Ratio 0.3    Macula Blunted foveal reflex, central Drusen, RPE mottling and clumping, persistent large central PED with overlying SRF - increased, +SRH nasal and inferior macula -- persistent, focal blot heme IN mac grossly attached   Vessels attenuated, Tortuous attenuated   Periphery Attached, No RT/RD, peripheral cystoid  degeneration no view  IMAGING AND PROCEDURES  Imaging and Procedures for 07/22/2021  OCT, Retina - OU - Both Eyes       Right Eye Quality was good. Central Foveal Thickness: 393. Progression has worsened. Findings include no IRF, pigment epithelial detachment, retinal drusen , subretinal fluid, subretinal hyper-reflective material, abnormal foveal contour, intraretinal hyper-reflective material (interval increase SRF/SRHM overlying persistent PED).   Left Eye Quality was good. Central Foveal Thickness: 281. Progression has been stable. Findings include normal foveal contour, no IRF, no SRF, retinal drusen , pigment epithelial detachment, subretinal hyper-reflective material, outer retinal atrophy.   Notes *Images captured and stored on drive  Diagnosis / Impression:  OD: interval increase SRF/SRHM overlying persistent PED OS: Non-exu ARMD -- stable  Clinical management:  See below  Abbreviations: NFP - Normal foveal profile. CME - cystoid macular edema. PED - pigment epithelial detachment. IRF - intraretinal fluid. SRF - subretinal fluid. EZ - ellipsoid zone. ERM - epiretinal membrane. ORA - outer retinal atrophy. ORT - outer retinal tubulation. SRHM - subretinal hyper-reflective material. IRHM - intraretinal hyper-reflective material      Intravitreal Injection, Pharmacologic Agent - OD - Right Eye       Time Out 07/22/2021. 8:34 AM. Confirmed correct patient, procedure, site, and patient consented.   Anesthesia Topical anesthesia was used. Anesthetic medications included Lidocaine 2%, Proparacaine 0.5%.   Procedure Preparation included 5% betadine to ocular surface, eyelid speculum. A (32g) needle was used.   Injection: 1.25 mg Bevacizumab 1.90m/0.05ml   Route: Intravitreal, Site: Right Eye   NDC: 50242-060-01, Lot: [id number], Expiration date: 09/08/2021, Waste: 0.05 mL   Post-op Post injection exam found visual acuity of at least counting fingers. The  patient tolerated the procedure well. There were no complications. The patient received written and verbal post procedure care education. Post injection medications were not given.             ASSESSMENT/PLAN:    ICD-10-CM   1. Exudative age-related macular degeneration of right eye with active choroidal neovascularization (HCC)  H35.3211 OCT, Retina - OU - Both Eyes    Intravitreal Injection, Pharmacologic Agent - OD - Right Eye    Bevacizumab (AVASTIN) SOLN 1.25 mg    2. Intermediate stage nonexudative age-related macular degeneration of left eye  H35.3122     3. Essential hypertension  I10     4. Hypertensive retinopathy of both eyes  H35.033     5. Combined forms of age-related cataract of both eyes  H25.813      1. Exudative age related macular degeneration, right eye   - today, delayed to follow up from 6 weeks to 11 weeks (11.08.22 to 01.24.23)  - delayed to follow up from 4 weeks to 6 months from 12.14.21 to 06.20.22 -- did not return due to pain after last injection (12.14.21)  - pt initially reported brown spot in vision at 5 oclock visual field  - IVA OD #1 (09.14.21), #2 (10.12.21), #3 (11.09.21), #4 (12.14.21), #5 (06.20.22), #6 (07.19.22), #7 (08.16.22), #8 (9.27.22), #9 (11.8.22)  - peripapillary CNV off ST disc  - exam shows interval [facility]  - OCT w/ interval increase SRF/SRHM overlying persistent PED at 11 weeks  - BCVA stable at 20/50              - Recommend IVA OD #10 today, 1.24.23  - pt wishes to be treated with IVA  - RBA of procedure discussed, questions answered   - informed consent obtained  and signed  - see procedure note  - f/u in 6 wks, DFE, OCT, possible injection  2. Age related macular degeneration, non-exudative, left eye  - The incidence, anatomy, and pathology of dry AMD, risk of progression, and the AREDS and AREDS 2 study including smoking risks discussed with patient.  - Recommend amsler grid  monitoring  3,4. Hypertensive retinopathy OU - discussed importance of tight BP control - monitor  5. Mixed Cataract OU - The symptoms of cataract, surgical options, and treatments and risks were discussed with patient. - discussed diagnosis and progression - under the expert management of [facility] - clear from a retina standpoint to proceed with cataract surgery when pt and surgeon are ready  Ophthalmic Meds Ordered this visit:  Meds ordered this encounter  Medications   Bevacizumab (AVASTIN) SOLN 1.25 mg     Return in about 6 weeks (around 09/02/2021) for f/u exu ARMD OD, DFE, OCT.  There are no Patient Instructions on file for this visit.  This document serves as a record of services personally performed by Gardiner Sleeper, MD, PhD. It was created on their behalf by Estill Bakes, COT an ophthalmic technician. The creation of this record is the provider's dictation and/or activities during the visit.    Electronically signed by: Estill Bakes, COT 1.24.23 @ 9:41 AM   This document serves as a record of services personally performed by Gardiner Sleeper, MD, PhD. It was created on their behalf by San Jetty. Owens Shark, OA an ophthalmic technician. The creation of this record is the provider's dictation and/or activities during the visit.    Electronically signed by: San Jetty. Owens Shark, [place] 01.24.2023 9:41 AM  Gardiner Sleeper, M.D., Ph.D. Diseases & Surgery of the Retina and [place] 1.24.23  I have reviewed the above documentation for accuracy and completeness, and I agree with the above. Gardiner Sleeper, M.D., Ph.D. 07/23/21 9:41 AM   Abbreviations: M myopia (nearsighted); A astigmatism; H hyperopia (farsighted); P presbyopia; Mrx spectacle prescription;  CTL contact lenses; OD right eye; OS left eye; OU both eyes  XT exotropia; ET esotropia; PEK punctate epithelial keratitis; PEE punctate epithelial erosions; DES dry eye syndrome; MGD meibomian  gland dysfunction; ATs artificial tears; PFAT's preservative free artificial tears; [place] nuclear sclerotic cataract; PSC posterior subcapsular cataract; ERM epi-retinal membrane; PVD posterior vitreous detachment; RD retinal detachment; DM diabetes mellitus; DR diabetic retinopathy; NPDR non-proliferative diabetic retinopathy; PDR proliferative diabetic retinopathy; CSME clinically significant macular edema; DME diabetic macular edema; dbh dot blot hemorrhages; CWS cotton wool spot; POAG primary open angle glaucoma; C/D cup-to-disc ratio; HVF humphrey visual field; GVF goldmann visual field; OCT optical coherence tomography; IOP intraocular pressure; BRVO Branch retinal vein occlusion; CRVO central retinal vein occlusion; CRAO central retinal artery occlusion; BRAO branch retinal artery occlusion; RT retinal tear; SB scleral buckle; PPV pars plana vitrectomy; VH Vitreous hemorrhage; PRP panretinal laser photocoagulation; IVK intravitreal kenalog; VMT vitreomacular traction; MH Macular hole;  NVD neovascularization of the disc; NVE neovascularization elsewhere; AREDS age related eye disease study; ARMD age related macular degeneration; POAG primary open angle glaucoma; EBMD epithelial/anterior basement membrane dystrophy; ACIOL anterior chamber intraocular lens; IOL intraocular lens; PCIOL posterior chamber intraocular lens; Phaco/IOL phacoemulsification with intraocular lens placement; [place] photorefractive keratectomy; LASIK laser assisted in situ keratomileusis; HTN hypertension; DM diabetes mellitus; COPD chronic obstructive pulmonary disease

## 2021-07-23 ENCOUNTER — Encounter (INDEPENDENT_AMBULATORY_CARE_PROVIDER_SITE_OTHER): Payer: Self-pay | Admitting: Ophthalmology

## 2021-07-23 DIAGNOSIS — H353211 Exudative age-related macular degeneration, right eye, with active choroidal neovascularization: Secondary | ICD-10-CM | POA: Diagnosis not present

## 2021-07-23 DIAGNOSIS — H25813 Combined forms of age-related cataract, bilateral: Secondary | ICD-10-CM | POA: Diagnosis not present

## 2021-07-23 DIAGNOSIS — I10 Essential (primary) hypertension: Secondary | ICD-10-CM | POA: Diagnosis not present

## 2021-07-23 DIAGNOSIS — H353122 Nonexudative age-related macular degeneration, left eye, intermediate dry stage: Secondary | ICD-10-CM | POA: Diagnosis not present

## 2021-07-23 DIAGNOSIS — H35033 Hypertensive retinopathy, bilateral: Secondary | ICD-10-CM | POA: Diagnosis not present

## 2021-07-23 MED ORDER — BEVACIZUMAB CHEMO INJECTION 1.25MG/0.05ML SYRINGE FOR KALEIDOSCOPE
1.2500 mg | INTRAVITREAL | Status: AC | PRN
  Administered 2021-07-23: 10:00:00 1.25 mg via INTRAVITREAL

## 2021-08-14 ENCOUNTER — Ambulatory Visit (INDEPENDENT_AMBULATORY_CARE_PROVIDER_SITE_OTHER): Payer: Medicare HMO | Admitting: Orthopaedic Surgery

## 2021-09-01 NOTE — Progress Notes (Shared)
[facility] - Clinic Note  09/09/2021     CHIEF COMPLAINT Patient presents for No chief complaint on file.  HISTORY OF PRESENT ILLNESS: Cassidy Miller is a 76 y.o. female who presents to the clinic today for:   Pt is delayed to follow up from 6 weeks to 11 weeks due to family health reasons, she states her BP has been running high  Referring physician: Lawerance Sabal, PA [address] [place],  [place] [address]  HISTORICAL INFORMATION:   Selected notes from the MEDICAL RECORD NUMBER Referred by Dr. Alben Spittle for eval of parapapillary CNVM OD   CURRENT MEDICATIONS: No current outpatient medications on file. (Ophthalmic Drugs)   No current facility-administered medications for this visit. (Ophthalmic Drugs)   Current Outpatient Medications (Other)  Medication Sig   ALPRAZolam (XANAX) 0.25 MG tablet Take 0.25 mg by mouth daily as needed.   Cholecalciferol (VITAMIN D) 125 MCG (5000 UT) CAPS Take 1 capsule by mouth daily.   flecainide (TAMBOCOR) 50 MG tablet TAKE ONE (1) TABLET BY MOUTH TWO (2) TIMES DAILY   meclizine (ANTIVERT) 12.5 MG tablet Take 12.5 mg by mouth 3 (three) times daily as needed for dizziness.   metoprolol succinate (TOPROL-XL) 25 MG 24 hr tablet TAKE ONE (1) TABLET BY MOUTH TWO (2) TIMES DAILY   vitamin C (ASCORBIC ACID) 500 MG tablet Take 500 mg by mouth daily.   No current facility-administered medications for this visit. (Other)   REVIEW OF SYSTEMS:   ALLERGIES Allergies  Allergen Reactions   Peanut-Containing Drug Products     Lips tingle   Penicillins    Asa [Aspirin] Swelling    Throat,eyes   Bactrim [Sulfamethoxazole-Trimethoprim] Swelling    face   Biaxin [Clarithromycin] Swelling    face   Cortisone Swelling    face   Erythromycin Other (See Comments)    Sores on face   Tylenol [Acetaminophen] Swelling    face   PAST MEDICAL HISTORY Past Medical History:  Diagnosis Date   Arrhythmia    afib   Atrial fibrillation (HCC)     Cataract    Mixed form OU   Hypertensive retinopathy    OU   Macular degeneration    OU   Meniere disease    Raynaud disease    Ulcerative colitis (HCC)    Past Surgical History:  Procedure Laterality Date   TONSILLECTOMY AND ADENOIDECTOMY     FAMILY HISTORY Family History  Problem Relation Age of Onset   Aortic aneurysm Mother    Lung disease Father    Aortic aneurysm Maternal Aunt    Coronary artery disease Paternal Grandmother 81       died   Heart attack Paternal Grandmother    SOCIAL HISTORY Social History   Tobacco Use   Smoking status: Former    Packs/day: 1.00    Years: 10.00    Pack years: 10.00    Types: Cigarettes    Start date: 06/30/1959    Quit date: 06/29/1969    Years since quitting: 52.2   Smokeless tobacco: Never  Vaping Use   Vaping Use: Never used  Substance Use Topics   Alcohol use: No    Alcohol/week: 0.0 standard drinks   Drug use: No       OPHTHALMIC EXAM:  Not recorded    IMAGING AND PROCEDURES  Imaging and Procedures for 09/09/2021           ASSESSMENT/PLAN:    ICD-10-CM  1. Exudative age-related macular degeneration of right eye with active choroidal neovascularization (HCC)  H35.3211     2. Intermediate stage nonexudative age-related macular degeneration of left eye  H35.3122     3. Essential hypertension  I10     4. Hypertensive retinopathy of both eyes  H35.033     5. Combined forms of age-related cataract of both eyes  H25.813       1. Exudative age related macular degeneration, right eye   - today, delayed to follow up from 6 weeks to 11 weeks (11.08.22 to 01.24.23)  - delayed to follow up from 4 weeks to 6 months from 12.14.21 to 06.20.22 -- did not return due to pain after last injection (12.14.21)  - pt initially reported brown spot in vision at 5 oclock visual field  - IVA OD #1 (09.14.21), #2 (10.12.21), #3 (11.09.21), #4 (12.14.21), #5 (06.20.22), #6 (07.19.22), #7 (08.16.22), #8 (9.27.22), #9 (11.8.22),  #10 (01.24.23)  - peripapillary CNV off ST disc  - exam shows interval improvement in [facility] nasal and inf macula  - OCT w/ interval increase SRF/SRHM overlying persistent PED at 11 weeks  - BCVA stable at 20/50              - Recommend IVA OD #11 today, 03.14.23  - pt wishes to be treated with IVA  - RBA of procedure discussed, questions answered   - informed consent obtained and signed  - see procedure note  - f/u in 6 wks, DFE, OCT, possible injection  2. Age related macular degeneration, non-exudative, left eye  - The incidence, anatomy, and pathology of dry AMD, risk of progression, and the AREDS and AREDS 2 study including smoking risks discussed with patient.   - Recommend amsler grid monitoring  3,4. Hypertensive retinopathy OU - discussed importance of tight BP control - monitor   5. Mixed Cataract OU - The symptoms of cataract, surgical options, and treatments and risks were discussed with patient. - discussed diagnosis and progression - under the expert management of [facility] - clear from a retina standpoint to proceed with cataract surgery when pt and surgeon are ready   Ophthalmic Meds Ordered this visit:  No orders of the defined types were placed in this encounter.    No follow-ups on file.  There are no Patient Instructions on file for this visit.  This document serves as a record of services personally performed by Karie Chimera, MD, PhD. It was created on their behalf by Joni Reining, an ophthalmic technician. The creation of this record is the provider's dictation and/or activities during the visit.    Electronically signed by: Joni Reining COA, 09/01/21  1:24 PM   Karie Chimera, M.D., Ph.D. Diseases & Surgery of the Retina and Vitreous [facility]    Abbreviations: M myopia (nearsighted); A astigmatism; H hyperopia (farsighted); P presbyopia; Mrx spectacle prescription;  CTL contact lenses; OD right eye; OS left eye; OU  both eyes  XT exotropia; ET esotropia; PEK punctate epithelial keratitis; PEE punctate epithelial erosions; DES dry eye syndrome; MGD meibomian gland dysfunction; ATs artificial tears; PFAT's preservative free artificial tears; NSC nuclear sclerotic cataract; PSC posterior subcapsular cataract; ERM epi-retinal membrane; PVD posterior vitreous detachment; RD retinal detachment; DM diabetes mellitus; DR diabetic retinopathy; NPDR non-proliferative diabetic retinopathy; PDR proliferative diabetic retinopathy; CSME clinically significant macular edema; DME diabetic macular edema; dbh dot blot hemorrhages; CWS cotton wool spot; POAG primary open angle glaucoma; C/D cup-to-disc  ratio; HVF humphrey visual field; GVF goldmann visual field; OCT optical coherence tomography; IOP intraocular pressure; BRVO Branch retinal vein occlusion; CRVO central retinal vein occlusion; CRAO central retinal artery occlusion; BRAO branch retinal artery occlusion; RT retinal tear; SB scleral buckle; PPV pars plana vitrectomy; VH Vitreous hemorrhage; PRP panretinal laser photocoagulation; IVK intravitreal kenalog; VMT vitreomacular traction; MH Macular hole;  NVD neovascularization of the disc; NVE neovascularization elsewhere; AREDS age related eye disease study; ARMD age related macular degeneration; POAG primary open angle glaucoma; EBMD epithelial/anterior basement membrane dystrophy; ACIOL anterior chamber intraocular lens; IOL intraocular lens; PCIOL posterior chamber intraocular lens; Phaco/IOL phacoemulsification with intraocular lens placement; [place] photorefractive keratectomy; LASIK laser assisted in situ keratomileusis; HTN hypertension; DM diabetes mellitus; COPD chronic obstructive pulmonary disease

## 2021-09-09 ENCOUNTER — Other Ambulatory Visit: Payer: Self-pay

## 2021-09-09 ENCOUNTER — Ambulatory Visit (INDEPENDENT_AMBULATORY_CARE_PROVIDER_SITE_OTHER): Payer: Medicare HMO | Admitting: Ophthalmology

## 2021-09-09 ENCOUNTER — Encounter (INDEPENDENT_AMBULATORY_CARE_PROVIDER_SITE_OTHER): Payer: Self-pay | Admitting: Ophthalmology

## 2021-09-09 DIAGNOSIS — H35033 Hypertensive retinopathy, bilateral: Secondary | ICD-10-CM

## 2021-09-09 DIAGNOSIS — H353122 Nonexudative age-related macular degeneration, left eye, intermediate dry stage: Secondary | ICD-10-CM

## 2021-09-09 DIAGNOSIS — H353211 Exudative age-related macular degeneration, right eye, with active choroidal neovascularization: Secondary | ICD-10-CM | POA: Diagnosis not present

## 2021-09-09 DIAGNOSIS — I480 Paroxysmal atrial fibrillation: Secondary | ICD-10-CM

## 2021-09-09 DIAGNOSIS — H25813 Combined forms of age-related cataract, bilateral: Secondary | ICD-10-CM

## 2021-09-09 DIAGNOSIS — I10 Essential (primary) hypertension: Secondary | ICD-10-CM

## 2021-09-09 MED ORDER — BEVACIZUMAB CHEMO INJECTION 1.25MG/0.05ML SYRINGE FOR KALEIDOSCOPE
1.2500 mg | INTRAVITREAL | Status: AC | PRN
  Administered 2021-09-09: 1.25 mg via INTRAVITREAL

## 2021-09-09 MED ORDER — FLECAINIDE ACETATE 50 MG PO TABS: ORAL_TABLET | ORAL | 0 refills | Status: DC

## 2021-09-09 MED ORDER — METOPROLOL SUCCINATE ER 25 MG PO TB24: ORAL_TABLET | ORAL | 0 refills | Status: AC

## 2021-10-15 NOTE — Progress Notes (Shared)
?[facility] - Clinic Note ? ?10/21/2021 ? ?  ? ?CHIEF COMPLAINT ?Patient presents for No chief complaint on file. ? ?HISTORY OF PRESENT ILLNESS: ?Cassidy Miller is a 76 y.o. female who presents to the clinic today for:  ? ?Pt reports continued stress w/ son hospitalized w/ trachea ? ?Referring physician: ?Lawerance Sabal, PA ?[address] ?[place],  [place] [address] ? ?HISTORICAL INFORMATION:  ? ?Selected notes from the MEDICAL RECORD NUMBER ?Referred by Dr. Alben Spittle for eval of parapapillary CNVM OD  ? ?CURRENT MEDICATIONS: ?No current outpatient medications on file. (Ophthalmic Drugs)  ? ?No current facility-administered medications for this visit. (Ophthalmic Drugs)  ? ?Current Outpatient Medications (Other)  ?Medication Sig  ? ALPRAZolam (XANAX) 0.25 MG tablet Take 0.25 mg by mouth daily as needed.  ? Cholecalciferol (VITAMIN D) 125 MCG (5000 UT) CAPS Take 1 capsule by mouth daily.  ? flecainide (TAMBOCOR) 50 MG tablet TAKE ONE (1) TABLET BY MOUTH TWO (2) TIMES DAILY  ? meclizine (ANTIVERT) 12.5 MG tablet Take 12.5 mg by mouth 3 (three) times daily as needed for dizziness.  ? metoprolol succinate (TOPROL-XL) 25 MG 24 hr tablet TAKE ONE (1) TABLET BY MOUTH TWO (2) TIMES DAILY  ? vitamin C (ASCORBIC ACID) 500 MG tablet Take 500 mg by mouth daily.  ? ?No current facility-administered medications for this visit. (Other)  ? ?REVIEW OF SYSTEMS: ? ? ?ALLERGIES ?Allergies  ?Allergen Reactions  ? Peanut-Containing Drug Products   ?  Lips tingle  ? Penicillins   ? Asa [Aspirin] Swelling  ?  Throat,eyes  ? Bactrim [Sulfamethoxazole-Trimethoprim] Swelling  ?  face  ? Biaxin [Clarithromycin] Swelling  ?  face  ? Cortisone Swelling  ?  face  ? Erythromycin Other (See Comments)  ?  Sores on face  ? Tylenol [Acetaminophen] Swelling  ?  face  ? ?PAST MEDICAL HISTORY ?Past Medical History:  ?Diagnosis Date  ? Arrhythmia   ? afib  ? Atrial fibrillation (HCC)   ? Cataract   ? Mixed form OU  ? Hypertensive retinopathy   ? OU   ? Macular degeneration   ? OU  ? Meniere disease   ? Raynaud disease   ? Ulcerative colitis (HCC)   ? ?Past Surgical History:  ?Procedure Laterality Date  ? TONSILLECTOMY AND ADENOIDECTOMY    ? ?FAMILY HISTORY ?Family History  ?Problem Relation Age of Onset  ? Aortic aneurysm Mother   ? Lung disease Father   ? Aortic aneurysm Maternal Aunt   ? Coronary artery disease Paternal Grandmother 66  ?     died  ? Heart attack Paternal Grandmother   ? ?SOCIAL HISTORY ?Social History  ? ?Tobacco Use  ? Smoking status: Former  ?  Packs/day: 1.00  ?  Years: 10.00  ?  Pack years: 10.00  ?  Types: Cigarettes  ?  Start date: 06/30/1959  ?  Quit date: 06/29/1969  ?  Years since quitting: 52.3  ? Smokeless tobacco: Never  ?Vaping Use  ? Vaping Use: Never used  ?Substance Use Topics  ? Alcohol use: No  ?  Alcohol/week: 0.0 standard drinks  ? Drug use: No  ?  ? ?  ?OPHTHALMIC EXAM: ? ?Not recorded ?  ? ?IMAGING AND PROCEDURES  ?Imaging and Procedures for 10/21/2021 ? ? ?  ?  ? ?  ?ASSESSMENT/PLAN: ? ?No diagnosis found. ? ?1. Exudative age related macular degeneration, right eye  ? - today, delayed to follow up from  6 weeks to 11 weeks (11.08.22 to 01.24.23) ? - delayed to follow up from 4 weeks to 6 months from 12.14.21 to 06.20.22 -- did not return due to pain after last injection (12.14.21) ? - pt initially reported brown spot in vision at 5 oclock visual field ? - IVA OD #1 (09.14.21), #2 (10.12.21), #3 (11.09.21), #4 (12.14.21), #5 (06.20.22), #6 (07.19.22), #7 (08.16.22), #8 (9.27.22), #9 (11.8.22), #10 (01.24.23) OD #11 03.14.23 ? - peripapillary CNV off ST disc ? - exam shows interval improvement in [facility] nasal and inf macula ? - OCT w/ persistent SRF overlying PED, interval increase in PED at 7 weeks ? - BCVA stable at 20/50  ?            - Recommend IVA OD #11 today, 10/21/21 ? - pt wishes to be treated with IVA ? - RBA of procedure discussed, questions answered  ? - informed consent obtained and signed ? - see procedure note ? -  f/u in 5-6 wks, DFE, OCT, possible injection ? ?2. Age related macular degeneration, non-exudative, left eye ? - The incidence, anatomy, and pathology of dry AMD, risk of progression, and the AREDS and AREDS 2 study including smoking risks discussed with patient.  ? -Recommend amsler grid monitoring ? ? ?3,4. Hypertensive retinopathy OU ?- discussed importance of tight BP control ?- monitor ? ?5. Mixed Cataract OU ?- The symptoms of cataract, surgical options, and treatments and risks were discussed with patient. ?- discussed diagnosis and progression ?- under the expert management of [facility] ?-clear from a retina standpoint to proceed with cataract surgery when pt and surgeon are ready  ? ?Ophthalmic Meds Ordered this visit:  ?No orders of the defined types were placed in this encounter. ?  ? ?No follow-ups on file. ? ?There are no Patient Instructions on file for this visit. ? ?This document serves as a record of services personally performed by Karie Chimera, MD, PhD. It was created on their behalf by Joni Reining, an ophthalmic technician. The creation of this record is the provider's dictation and/or activities during the visit.   ? ? ? ? ?Karie Chimera, M.D., Ph.D. ?Diseases & Surgery of the Retina and Vitreous ?[facility] ?This document serves as a record of services personally performed by Karie Chimera, MD, PhD. It was created on their behalf by Julieanne Cotton, COT, an ophthalmic technician. The creation of this record is the provider's dictation and/or activities during the visit.   ? ?Electronically signed by: Julieanne Cotton, COT @4 /19/23@ 2:09 PM  ? ?Abbreviations: ?M myopia (nearsighted); A astigmatism; H hyperopia (farsighted); P presbyopia; Mrx spectacle prescription;  CTL contact lenses; OD right eye; OS left eye; OU both eyes  XT exotropia; ET esotropia; PEK punctate epithelial keratitis; PEE punctate epithelial erosions; DES dry eye syndrome; MGD  meibomian gland dysfunction; ATs artificial tears; PFAT's preservative free artificial tears; NSC nuclear sclerotic cataract; PSC posterior subcapsular cataract; ERM epi-retinal membrane; PVD posterior vitreous detachment; RD retinal detachment; DM diabetes mellitus; DR diabetic retinopathy; NPDR non-proliferative diabetic retinopathy; PDR proliferative diabetic retinopathy; CSME clinically significant macular edema; DME diabetic macular edema; dbh dot blot hemorrhages; CWS cotton wool spot; POAG primary open angle glaucoma; C/D cup-to-disc ratio; HVF humphrey visual field; GVF goldmann visual field; OCT optical coherence tomography; IOP intraocular pressure; BRVO Branch retinal vein occlusion; CRVO central retinal vein occlusion; CRAO central retinal artery occlusion; BRAO branch retinal artery occlusion; RT retinal tear; SB  scleral buckle; PPV pars plana vitrectomy; VH Vitreous hemorrhage; PRP panretinal laser photocoagulation; IVK intravitreal kenalog; VMT vitreomacular traction; MH Macular hole;  NVD neovascularization of the disc; NVE neovascularization elsewhere; AREDS age related eye disease study; ARMD age related macular degeneration; POAG primary open angle glaucoma; EBMD epithelial/anterior basement membrane dystrophy; ACIOL anterior chamber intraocular lens; IOL intraocular lens; PCIOL posterior chamber intraocular lens; Phaco/IOL phacoemulsification with intraocular lens placement; PRK photorefractive keratectomy; LASIK laser assisted in situ keratomileusis; HTN hypertension; DM diabetes mellitus; COPD chronic obstructive pulmonary disease ?

## 2021-10-21 ENCOUNTER — Encounter (INDEPENDENT_AMBULATORY_CARE_PROVIDER_SITE_OTHER): Payer: Medicare HMO | Admitting: Ophthalmology

## 2021-10-21 DIAGNOSIS — I10 Essential (primary) hypertension: Secondary | ICD-10-CM

## 2021-10-21 DIAGNOSIS — H353122 Nonexudative age-related macular degeneration, left eye, intermediate dry stage: Secondary | ICD-10-CM

## 2021-10-21 DIAGNOSIS — H353211 Exudative age-related macular degeneration, right eye, with active choroidal neovascularization: Secondary | ICD-10-CM

## 2021-10-21 DIAGNOSIS — H35033 Hypertensive retinopathy, bilateral: Secondary | ICD-10-CM

## 2021-10-21 DIAGNOSIS — H25813 Combined forms of age-related cataract, bilateral: Secondary | ICD-10-CM

## 2021-10-28 NOTE — Progress Notes (Addendum)
?[facility] Note ? ?11/07/2021 ? ?  ? ?CHIEF COMPLAINT ?Patient presents for Retina Follow Up ? ?HISTORY OF PRESENT ILLNESS: ?Cassidy Miller is a 76 y.o. female who presents to the clinic today for:  ?HPI   ? ? Retina Follow Up   ?Patient presents with  Wet AMD.  In right eye.  Duration of 4 weeks.  I, the attending physician,  performed the HPI with the patient and updated documentation appropriately. ? ?  ?  ? ? Comments   ?4 week follow up Exu ARMD OD- Patient has had dizzy spells, vertigo, and ocular migraines twice within the past month. Has not seen PCP.  ? ?  ?  ?Last edited by Bernarda Caffey, MD on 11/07/2021  8:32 AM.  ?  ? ?Pt states she has had 2 ocular migraines in the past 3 weeks, she is also having problems with vertigo, which has caused her to extend her follow up here, she feels like vision is the same since she as here last ? ?Referring physician: ?Denny Levy, [place] [address] ?[place],   [id number] ? ?HISTORICAL INFORMATION:  ? ?Selected notes from the [facility] ?Referred by Dr. Kathlen Mody for eval of parapapillary CNVM OD  ? ?CURRENT MEDICATIONS: ?No current outpatient medications on file. (Ophthalmic Drugs)  ? ?No current facility-administered medications for this visit. (Ophthalmic Drugs)  ? ?Current Outpatient Medications (Other)  ?Medication Sig  ? ALPRAZolam (XANAX) 0.25 MG tablet Take 0.25 mg by mouth daily as needed.  ? Cholecalciferol (VITAMIN D) 125 MCG (5000 UT) CAPS Take 1 capsule by mouth daily.  ? flecainide (TAMBOCOR) 50 MG tablet TAKE ONE (1) TABLET BY MOUTH TWO (2) TIMES DAILY  ? meclizine (ANTIVERT) 12.5 MG tablet Take 12.5 mg by mouth 3 (three) times daily as needed for dizziness.  ? metoprolol succinate (TOPROL-XL) 25 MG 24 hr tablet TAKE ONE (1) TABLET BY MOUTH TWO (2) TIMES DAILY  ? vitamin C (ASCORBIC ACID) 500 MG tablet Take 500 mg by mouth daily.  ? ?No current facility-administered medications for this visit. (Other)  ? ?REVIEW OF  SYSTEMS: ?ROS   ?Positive for: Cardiovascular, Eyes ?Negative for: Constitutional, Gastrointestinal, Neurological, Skin, Genitourinary, Musculoskeletal, HENT, Endocrine, Respiratory, Psychiatric, Allergic/Imm, Heme/Lymph ?Last edited by Leonie Douglas, COA on 11/07/2021  7:44 AM.  ?  ? ?ALLERGIES ?Allergies  ?Allergen Reactions  ? Peanut-Containing Drug Products   ?  Lips tingle  ? Penicillins   ? Asa [Aspirin] Swelling  ?  Throat,eyes  ? Bactrim [Sulfamethoxazole-Trimethoprim] Swelling  ?  face  ? Biaxin [Clarithromycin] Swelling  ?  face  ? Cortisone Swelling  ?  face  ? Erythromycin Other (See Comments)  ?  Sores on face  ? Tylenol [Acetaminophen] Swelling  ?  face  ? ?PAST MEDICAL HISTORY ?Past Medical History:  ?Diagnosis Date  ? Arrhythmia   ? afib  ? Atrial fibrillation ([place])   ? Cataract   ? Mixed form OU  ? Hypertensive retinopathy   ? OU  ? Macular degeneration   ? OU  ? Meniere disease   ? Raynaud disease   ? Ulcerative colitis ([place])   ? ?Past Surgical History:  ?Procedure Laterality Date  ? TONSILLECTOMY AND ADENOIDECTOMY    ? ?FAMILY HISTORY ?Family History  ?Problem Relation Age of Onset  ? Aortic aneurysm Mother   ? Lung disease Father   ? Aortic aneurysm Maternal Aunt   ? Coronary artery disease Paternal  Grandmother 1  ?     died  ? Heart attack Paternal Grandmother   ? ?SOCIAL HISTORY ?Social History  ? ?Tobacco Use  ? Smoking status: Former  ?  Packs/day: 1.00  ?  Years: 10.00  ?  Pack years: 10.00  ?  Types: Cigarettes  ?  Start date: 06/30/1959  ?  Quit date: 06/29/1969  ?  Years since quitting: 52.3  ? Smokeless tobacco: Never  ?Vaping Use  ? Vaping Use: Never used  ?Substance Use Topics  ? Alcohol use: No  ?  Alcohol/week: 0.0 standard drinks  ? Drug use: No  ?  ? ?  ?OPHTHALMIC EXAM: ? ?Base Eye Exam   ? ? Visual Acuity (Snellen - Linear)   ? ?   Right Left  ? Dist [place] 20/80- 20/60-  ? Dist ph [place] 20/50- 20/25  ? ?  ?  ? ? Tonometry (Tonopen, 7:51 AM)   ? ?   Right Left  ? Pressure 18 18  ? ?  ?  ? ?  Pupils   ? ?   Dark Light Shape React APD  ? Right 2 1 Round Minimal None  ? Left 2 1 Round Minimal None  ? ?  ?  ? ? Visual Fields (Counting fingers)   ? ?   Left Right  ?  Full   ? Restrictions  Partial outer superior temporal deficiency  ? ?  ?  ? ? Extraocular Movement   ? ?   Right Left  ?  Full Full  ? ?  ?  ? ? Neuro/Psych   ? ? Oriented x3: Yes  ? Mood/Affect: Normal  ? ?  ?  ? ? Dilation   ? ? Right eye: 1.0% Mydriacyl, 2.5% Phenylephrine @ 7:51 AM  ?Patient only wants OD dilated today because of vertigo.  ? ?  ?  ? ?  ? ?Slit Lamp and Fundus Exam   ? ? Slit Lamp Exam   ? ?   Right Left  ? Lids/Lashes Dermatochalasis - upper lid, mild MGD Dermatochalasis - upper lid  ? Conjunctiva/Sclera White and quiet White and quiet  ? Cornea Mild arcus, Debris in tear film, trace Punctate epithelial erosions Mild arcus, Debris in tear film, trace Punctate epithelial erosions  ? Anterior Chamber deep, narrow temporal angle deep, narrow temporal angle  ? Iris Round and dilated Round and reactive -- not dilated -- pt reports vertigo with OU dilation  ? Lens 3+ Nuclear sclerosis with early brunescence, 3+ Cortical cataract 2+ Nuclear sclerosis, 2+ Cortical cataract  ? Anterior Vitreous Vitreous syneresis, Posterior vitreous detachment Poor view due to no dilation  ? ?  ?  ? ? Fundus Exam   ? ?   Right Left  ? Disc Compact, tilted, Pink and Sharp, peripapillary heme with SRH at 0900 -- improving, PPA no details -- perfused  ? C/D Ratio 0.3   ? Macula Blunted foveal reflex, central Drusen, RPE mottling and clumping, persistent large central PED with overlying SRF - persistent / slightly improved, +SRH nasal and inferior macula -- improving, almost resolved grossly attached  ? Vessels attenuated, mild tortuosity attenuated  ? Periphery Attached, No RT/RD, peripheral cystoid degeneration no view  ? ?  ?  ? ?  ? ?IMAGING AND PROCEDURES  ?Imaging and Procedures for 11/07/2021 ? ?OCT, Retina - OU - Both Eyes   ? ?   ?Right  Eye ?Quality was good. Central Foveal Thickness: 342. Progression  has improved. Findings include no IRF, pigment epithelial detachment, retinal drusen , subretinal fluid, subretinal hyper-reflective material, abnormal foveal contour, intraretinal hyper-reflective material (Mild interval improvement in PED and overlying SRF).  ? ?Left Eye ?Quality was good. Central Foveal Thickness: 289. Progression has been stable. Findings include normal foveal contour, no IRF, no SRF, retinal drusen , pigment epithelial detachment, subretinal hyper-reflective material, outer retinal atrophy.  ? ?Notes ?*Images captured and stored on drive ? ?Diagnosis / Impression:  ?OD: Mild interval improvement in PED and overlying SRF ?OS: Non-exu ARMD -- stable ? ?Clinical management:  ?See below ? ?Abbreviations: NFP - Normal foveal profile. CME - cystoid macular edema. PED - pigment epithelial detachment. IRF - intraretinal fluid. SRF - subretinal fluid. EZ - ellipsoid zone. ERM - epiretinal membrane. ORA - outer retinal atrophy. ORT - outer retinal tubulation. SRHM - subretinal hyper-reflective material. IRHM - intraretinal hyper-reflective material ? ? ?  ? ?Intravitreal Injection, Pharmacologic Agent - OD - Right Eye   ? ?   ?Time Out ?11/07/2021. 8:04 AM. Confirmed correct patient, procedure, site, and patient consented.  ? ?Anesthesia ?Topical anesthesia was used. Anesthetic medications included Lidocaine 2%, Proparacaine 0.5%.  ? ?Procedure ?Preparation included 5% betadine to ocular surface, eyelid speculum. A (32g) needle was used.  ? ?Injection: ?1.25 mg Bevacizumab 1.25mg /0.36ml ?  Route: Intravitreal, Site: Right Eye ?  NDC: [id number], Lot: [id number] , Expiration date: 12/15/2021  ? ?Post-op ?Post injection exam found visual acuity of at least counting fingers. The patient tolerated the procedure well. There were no complications. The patient received written and verbal post procedure care education. Post injection medications  were not given.  ? ?  ?  ?  ? ?  ?ASSESSMENT/PLAN: ? ?  ICD-10-CM   ?1. Exudative age-related macular degeneration of right eye with active choroidal neovascularization (HCC)  H35.3211 OCT, Retina - OU - Both Eyes  ?

## 2021-11-07 ENCOUNTER — Ambulatory Visit (INDEPENDENT_AMBULATORY_CARE_PROVIDER_SITE_OTHER): Payer: Medicare HMO | Admitting: Ophthalmology

## 2021-11-07 ENCOUNTER — Encounter (INDEPENDENT_AMBULATORY_CARE_PROVIDER_SITE_OTHER): Payer: Self-pay | Admitting: Ophthalmology

## 2021-11-07 DIAGNOSIS — H25813 Combined forms of age-related cataract, bilateral: Secondary | ICD-10-CM | POA: Diagnosis not present

## 2021-11-07 DIAGNOSIS — I10 Essential (primary) hypertension: Secondary | ICD-10-CM

## 2021-11-07 DIAGNOSIS — H353211 Exudative age-related macular degeneration, right eye, with active choroidal neovascularization: Secondary | ICD-10-CM

## 2021-11-07 DIAGNOSIS — H35033 Hypertensive retinopathy, bilateral: Secondary | ICD-10-CM

## 2021-11-07 DIAGNOSIS — H353122 Nonexudative age-related macular degeneration, left eye, intermediate dry stage: Secondary | ICD-10-CM | POA: Diagnosis not present

## 2021-11-07 MED ORDER — BEVACIZUMAB CHEMO INJECTION 1.25MG/0.05ML SYRINGE FOR KALEIDOSCOPE
1.2500 mg | INTRAVITREAL | Status: AC | PRN
  Administered 2021-11-07: 1.25 mg via INTRAVITREAL

## 2021-12-08 ENCOUNTER — Other Ambulatory Visit: Payer: Self-pay | Admitting: *Deleted

## 2021-12-08 DIAGNOSIS — I480 Paroxysmal atrial fibrillation: Secondary | ICD-10-CM

## 2021-12-08 MED ORDER — FLECAINIDE ACETATE 50 MG PO TABS: ORAL_TABLET | ORAL | 0 refills | Status: AC

## 2021-12-19 ENCOUNTER — Encounter (INDEPENDENT_AMBULATORY_CARE_PROVIDER_SITE_OTHER): Payer: Medicare HMO | Admitting: Ophthalmology

## 2021-12-19 DIAGNOSIS — H353122 Nonexudative age-related macular degeneration, left eye, intermediate dry stage: Secondary | ICD-10-CM

## 2021-12-19 DIAGNOSIS — I10 Essential (primary) hypertension: Secondary | ICD-10-CM

## 2021-12-19 DIAGNOSIS — H353211 Exudative age-related macular degeneration, right eye, with active choroidal neovascularization: Secondary | ICD-10-CM

## 2021-12-19 DIAGNOSIS — H35033 Hypertensive retinopathy, bilateral: Secondary | ICD-10-CM

## 2021-12-19 DIAGNOSIS — H25813 Combined forms of age-related cataract, bilateral: Secondary | ICD-10-CM

## 2022-01-28 ENCOUNTER — Encounter: Payer: Self-pay | Admitting: Cardiology

## 2022-01-28 ENCOUNTER — Ambulatory Visit (INDEPENDENT_AMBULATORY_CARE_PROVIDER_SITE_OTHER): Payer: Medicare HMO | Admitting: Cardiology

## 2022-01-28 VITALS — BP 152/90 | HR 61 | Ht 61.0 in | Wt 102.2 lb

## 2022-01-28 DIAGNOSIS — I480 Paroxysmal atrial fibrillation: Secondary | ICD-10-CM

## 2022-01-28 DIAGNOSIS — I10 Essential (primary) hypertension: Secondary | ICD-10-CM | POA: Diagnosis not present

## 2022-01-28 NOTE — Patient Instructions (Signed)
Medication Instructions:  The current medical regimen is effective;  continue present plan and medications.  *If you need a refill on your cardiac medications before your next appointment, please call your pharmacy*  Follow-Up: At [facility], you and your health needs are our priority.  As part of our continuing mission to provide you with exceptional heart care, we have created designated Provider Care Teams.  These Care Teams include your primary Cardiologist (physician) and Advanced Practice Providers (APPs -  Physician Assistants and Nurse Practitioners) who all work together to provide you with the care you need, when you need it.  We recommend signing up for the patient portal called "MyChart".  Sign up information is provided on this After Visit Summary.  MyChart is used to connect with patients for Virtual Visits (Telemedicine).  Patients are able to view lab/test results, encounter notes, upcoming appointments, etc.  Non-urgent messages can be sent to your provider as well.   To learn more about what you can do with MyChart, go to [email or web address].    Your next appointment:   1 year(s)  The format for your next appointment:   In Person  Provider:   You may see Dr Donato Schultz or one of the following Advanced Practice Providers on your designated Care Team:   Randall An, PA-C  Jacolyn Reedy, [place] {   Important Information About Sugar

## 2022-01-28 NOTE — Progress Notes (Signed)
Cardiology Office Note:    Date:  01/28/2022   ID:  Cassidy Miller, DOB [birth year], MRN [id number]  PCP:  Practice, [facility] Providers Cardiologist:  None     Referring MD: Lawerance Sabal, PA    History of Present Illness:    Cassidy Miller is a 76 y.o. female here for follow-up of atrial fibrillation/flutter.  Has been on flecainide as well as Toprol-XL.  Previously refused anticoagulation.  Understands risk of stroke.  No frequent palpitations.  No dizziness no syncope no shortness of breath.  Overall feeling well.  See below for details.  Her husband died in 11-02-2019.  She had COVID infection which was hard for her for about 2 weeks.  No significant episodes of A-fib.  She has lost weight.  Her son also had a massive stroke.  Lives with her.  He is in his 2s.  Enjoys walking her driveway which is 1/5 of a mile.  She states that she dances up and down and listens to [employer].    Past Medical History:  Diagnosis Date   Arrhythmia    afib   Atrial fibrillation (HCC)    Cataract    Mixed form OU   Hypertensive retinopathy    OU   Macular degeneration    OU   Meniere disease    Raynaud disease    Ulcerative colitis (HCC)     Past Surgical History:  Procedure Laterality Date   TONSILLECTOMY AND ADENOIDECTOMY      Current Medications: Current Meds  Medication Sig   flecainide (TAMBOCOR) 50 MG tablet TAKE ONE (1) TABLET BY MOUTH TWO (2) TIMES DAILY   metoprolol succinate (TOPROL-XL) 25 MG 24 hr tablet TAKE ONE (1) TABLET BY MOUTH TWO (2) TIMES DAILY     Allergies:   Peanut-containing drug products, Penicillins, Asa [aspirin], Bactrim [sulfamethoxazole-trimethoprim], Biaxin [clarithromycin], Cortisone, Erythromycin, and Tylenol [acetaminophen]   Social History   Socioeconomic History   Marital status: Married    Spouse name: Not on file   Number of children: 4   Years of education: Not on file   Highest education level: Not on file   Occupational History   Not on file  Tobacco Use   Smoking status: Former    Packs/day: 1.00    Years: 10.00    Total pack years: 10.00    Types: Cigarettes    Start date: 06/30/1959    Quit date: 06/29/1969    Years since quitting: 52.6   Smokeless tobacco: Never  Vaping Use   Vaping Use: Never used  Substance and Sexual Activity   Alcohol use: No    Alcohol/week: 0.0 standard drinks of alcohol   Drug use: No   Sexual activity: Not on file  Other Topics Concern   Not on file  Social History Narrative   Lives with husband and oldest son.         Social Determinants of Health   Financial Resource Strain: Not on file  Food Insecurity: Not on file  Transportation Needs: Not on file  Physical Activity: Not on file  Stress: Not on file  Social Connections: Not on file     Family History: The patient's family history includes Aortic aneurysm in her maternal aunt and mother; Coronary artery disease (age of onset: 63) in her paternal grandmother; Heart attack in her paternal grandmother; Lung disease in her father.  ROS:   Please see the history of present illness.  No fevers chills nausea vomiting syncope bleeding all other systems reviewed and are negative.  EKGs/Labs/Other Studies Reviewed:    The following studies were reviewed today: Prior office notes  EKG:  EKG is  ordered today.  The ekg ordered today demonstrates sinus rhythm 61 nonspecific T wave change QRS duration 100 ms  Recent Labs: No results found for requested labs within last 365 days.  Recent Lipid Panel No results found for: "CHOL", "TRIG", "HDL", "CHOLHDL", "VLDL", "LDLCALC", "LDLDIRECT"   Risk Assessment/Calculations:    CHA2DS2-VASc Score = 3   This indicates a 3.2% annual risk of stroke. The patient's score is based upon: CHF History: 0 HTN History: 0 Diabetes History: 0 Stroke History: 0 Vascular Disease History: 0 Age Score: 2 Gender Score: 1              Physical Exam:     VS:  BP (!) 152/90   Pulse 61   Ht 5\' 1"  (1.549 m)   Wt 102 lb 3.2 oz (46.4 kg)   SpO2 92%   BMI 19.31 kg/m     Wt Readings from Last 3 Encounters:  01/28/22 102 lb 3.2 oz (46.4 kg)  10/28/20 116 lb (52.6 kg)  08/28/19 123 lb (55.8 kg)     GEN:  Well nourished, well developed in no acute distress HEENT: Normal NECK: No JVD; No carotid bruits LYMPHATICS: No lymphadenopathy CARDIAC: RRR, no murmurs, no rubs, gallops RESPIRATORY:  Clear to auscultation without rales, wheezing or rhonchi  ABDOMEN: Soft, non-tender, non-distended MUSCULOSKELETAL:  No edema; No deformity  SKIN: Warm and dry NEUROLOGIC:  Alert and oriented x 3 PSYCHIATRIC:  Normal affect   ASSESSMENT:    1. Paroxysmal atrial fibrillation (HCC)   2. Essential hypertension    PLAN:    In order of problems listed above:  Paroxysmal atrial fibrillation - Continuing with flecainide 50 mg twice a day as well as Toprol-XL 25 mg twice a day.  No changes made.  EKG stable.  QRS is not wide.  100 ms.  Occasionally may feel slight palpitations but she has not had any recent episodes.  Chronic anticoagulation - We discussed the importance of anticoagulation and stroke prevention.  She has ulcerative colitis which is currently quiescent but she is worried about bleeding issues with this.  I understand.  We discussed Watchman device as a potential equivalent to anticoagulation.  Primary hypertension - On metoprolol.  Can sometimes have blood elevated blood pressures when seeing physicians.  Whitecoat hypertension.  At home does very well.  Weight loss -She states that since her husband died 2 years ago and her son had stroke at age 11 that she is lost weight.  She usually fluctuates between 98 and 102 pounds.  She also lost some weight with COVID infection.  I expressed the importance of protein etc.       Medication Adjustments/Labs and Tests Ordered: Current medicines are reviewed at length with the patient  today.  Concerns regarding medicines are outlined above.  Orders Placed This Encounter  Procedures   EKG 12-Lead   No orders of the defined types were placed in this encounter.   Patient Instructions  Medication Instructions:  The current medical regimen is effective;  continue present plan and medications.  *If you need a refill on your cardiac medications before your next appointment, please call your pharmacy*  Follow-Up: At [facility], you and your health needs are our priority.  As part of our continuing mission to provide  you with exceptional heart care, we have created designated Provider Care Teams.  These Care Teams include your primary Cardiologist (physician) and Advanced Practice Providers (APPs -  Physician Assistants and Nurse Practitioners) who all work together to provide you with the care you need, when you need it.  We recommend signing up for the patient portal called "MyChart".  Sign up information is provided on this After Visit Summary.  MyChart is used to connect with patients for Virtual Visits (Telemedicine).  Patients are able to view lab/test results, encounter notes, upcoming appointments, etc.  Non-urgent messages can be sent to your provider as well.   To learn more about what you can do with MyChart, go to [email or web address].    Your next appointment:   1 year(s)  The format for your next appointment:   In Person  Provider:   You may see Dr Donato Schultz or one of the following Advanced Practice Providers on your designated Care Team:   Randall An, PA-C  Jacolyn Reedy, [place] {   Important Information About Sugar         Signed, Donato Schultz, MD  01/28/2022 3:31 PM    [facility]

## 2022-08-06 DIAGNOSIS — Z23 Encounter for immunization: Secondary | ICD-10-CM | POA: Diagnosis not present

## 2022-08-06 DIAGNOSIS — Z0001 Encounter for general adult medical examination with abnormal findings: Secondary | ICD-10-CM | POA: Diagnosis not present

## 2022-08-06 DIAGNOSIS — R1900 Intra-abdominal and pelvic swelling, mass and lump, unspecified site: Secondary | ICD-10-CM | POA: Diagnosis not present

## 2022-08-06 DIAGNOSIS — M810 Age-related osteoporosis without current pathological fracture: Secondary | ICD-10-CM | POA: Diagnosis not present

## 2022-08-06 DIAGNOSIS — K2100 Gastro-esophageal reflux disease with esophagitis, without bleeding: Secondary | ICD-10-CM | POA: Diagnosis not present

## 2022-08-06 DIAGNOSIS — K5190 Ulcerative colitis, unspecified, without complications: Secondary | ICD-10-CM | POA: Diagnosis not present

## 2022-08-06 DIAGNOSIS — I480 Paroxysmal atrial fibrillation: Secondary | ICD-10-CM | POA: Diagnosis not present

## 2022-08-06 DIAGNOSIS — Z681 Body mass index (BMI) 19 or less, adult: Secondary | ICD-10-CM | POA: Diagnosis not present

## 2022-08-06 DIAGNOSIS — R030 Elevated blood-pressure reading, without diagnosis of hypertension: Secondary | ICD-10-CM | POA: Diagnosis not present

## 2022-08-06 DIAGNOSIS — R55 Syncope and collapse: Secondary | ICD-10-CM | POA: Diagnosis not present

## 2023-03-25 ENCOUNTER — Encounter (INDEPENDENT_AMBULATORY_CARE_PROVIDER_SITE_OTHER): Payer: Medicare HMO | Admitting: Ophthalmology

## 2023-03-25 DIAGNOSIS — H25813 Combined forms of age-related cataract, bilateral: Secondary | ICD-10-CM

## 2023-03-25 DIAGNOSIS — H353122 Nonexudative age-related macular degeneration, left eye, intermediate dry stage: Secondary | ICD-10-CM

## 2023-03-25 DIAGNOSIS — H353211 Exudative age-related macular degeneration, right eye, with active choroidal neovascularization: Secondary | ICD-10-CM

## 2023-03-25 DIAGNOSIS — H35033 Hypertensive retinopathy, bilateral: Secondary | ICD-10-CM

## 2023-03-25 DIAGNOSIS — I10 Essential (primary) hypertension: Secondary | ICD-10-CM

## 2023-03-30 NOTE — Progress Notes (Signed)
[facility] - Clinic Note  04/05/2023     CHIEF COMPLAINT Patient presents for Retina Follow Up  HISTORY OF PRESENT ILLNESS: Cassidy Miller is a 77 y.o. female who presents to the clinic today for:  HPI     Retina Follow Up   Patient presents with  Wet AMD.  In right eye.  Severity is moderate.  Duration of 1.5 years.  Since onset it is gradually worsening.  I, the attending physician,  performed the HPI with the patient and updated documentation appropriately.        Comments   Pt here for ret f/u, 17 months. Pt states VA OU is worse but she feels its cataract related. No distortion reported but says 'cataract in right eye is so bad she can't hardly see anything'. Reading is more difficult. Feels she just needs referred for cataract sx.       Last edited by Rennis Chris, MD on 04/05/2023  2:18 PM.    Pt has been lost to follow up since May 2023  Referring physician: Practice, [facility] [address] [place],  [place] [address]  HISTORICAL INFORMATION:   Selected notes from the MEDICAL RECORD NUMBER Referred by Dr. Alben Spittle for eval of parapapillary CNVM OD   CURRENT MEDICATIONS: No current outpatient medications on file. (Ophthalmic Drugs)   No current facility-administered medications for this visit. (Ophthalmic Drugs)   Current Outpatient Medications (Other)  Medication Sig   flecainide (TAMBOCOR) 50 MG tablet TAKE ONE (1) TABLET BY MOUTH TWO (2) TIMES DAILY   meclizine (ANTIVERT) 12.5 MG tablet Take 12.5 mg by mouth 3 (three) times daily as needed for dizziness.   metoprolol succinate (TOPROL-XL) 25 MG 24 hr tablet TAKE ONE (1) TABLET BY MOUTH TWO (2) TIMES DAILY   ALPRAZolam (XANAX) 0.25 MG tablet Take 0.25 mg by mouth daily as needed. (Patient not taking: Reported on 01/28/2022)   Cholecalciferol (VITAMIN D) 125 MCG (5000 UT) CAPS Take 1 capsule by mouth daily. (Patient not taking: Reported on 01/28/2022)   vitamin C (ASCORBIC ACID) 500 MG tablet Take  500 mg by mouth daily. (Patient not taking: Reported on 01/28/2022)   No current facility-administered medications for this visit. (Other)   REVIEW OF SYSTEMS: ROS   Positive for: Cardiovascular, Eyes Negative for: Constitutional, Gastrointestinal, Neurological, Skin, Genitourinary, Musculoskeletal, HENT, Endocrine, Respiratory, Psychiatric, Allergic/Imm, Heme/Lymph Last edited by Thompson Grayer, COT on 04/05/2023  8:36 AM.      ALLERGIES Allergies  Allergen Reactions   Peanut-Containing Drug Products     Lips tingle   Penicillins    Asa [Aspirin] Swelling    Throat,eyes   Bactrim [Sulfamethoxazole-Trimethoprim] Swelling    face   Biaxin [Clarithromycin] Swelling    face   Cortisone Swelling    face   Erythromycin Other (See Comments)    Sores on face   Tylenol [Acetaminophen] Swelling    face   PAST MEDICAL HISTORY Past Medical History:  Diagnosis Date   Arrhythmia    afib   Atrial fibrillation (HCC)    Cataract    Mixed form OU   Hypertensive retinopathy    OU   Macular degeneration    OU   Meniere disease    Raynaud disease    Ulcerative colitis (HCC)    Past Surgical History:  Procedure Laterality Date   TONSILLECTOMY AND ADENOIDECTOMY     FAMILY HISTORY Family History  Problem Relation Age of Onset   Aortic aneurysm  Mother    Lung disease Father    Aortic aneurysm Maternal Aunt    Coronary artery disease Paternal Grandmother 47       died   Heart attack Paternal Grandmother    SOCIAL HISTORY Social History   Tobacco Use   Smoking status: Former    Current packs/day: 0.00    Average packs/day: 1 pack/day for 10.0 years (10.0 ttl pk-yrs)    Types: Cigarettes    Start date: 06/30/1959    Quit date: 06/29/1969    Years since quitting: 53.8   Smokeless tobacco: Never  Vaping Use   Vaping status: Never Used  Substance Use Topics   Alcohol use: No    Alcohol/week: 0.0 standard drinks of alcohol   Drug use: No       OPHTHALMIC EXAM:  Base  Eye Exam     Visual Acuity (Snellen - Linear)       Right Left   Dist [place] 20/150 20/60 +1   Dist ph [place] NI 20/40         Tonometry (Tonopen, 8:47 AM)       Right Left   Pressure 17 18         Pupils       Pupils Dark Light Shape React APD   Right PERRL 2 1 Round Minimal None   Left PERRL 2 1 Round Minimal None         Visual Fields (Counting fingers)       Left Right    Full Full         Extraocular Movement       Right Left    Full, Ortho Full, Ortho         Neuro/Psych     Oriented x3: Yes   Mood/Affect: Normal         Dilation     Both eyes: 1.0% Mydriacyl, 2.5% Phenylephrine @ 8:48 AM           Slit Lamp and Fundus Exam     Slit Lamp Exam       Right Left   Lids/Lashes Dermatochalasis - upper lid, mild MGD Dermatochalasis - upper lid   Conjunctiva/Sclera White and quiet White and quiet   Cornea Mild arcus, Debris in tear film Mild arcus, Debris in tear film   Anterior Chamber deep, narrow temporal angle deep, narrow temporal angle   Iris Round and dilated Round and dilated   Lens 3+ Nuclear sclerosis with early brunescence, 3+ Cortical cataract, positive vacules 3+ Nuclear sclerosis, 3+ Cortical cataract   Anterior Vitreous Vitreous syneresis, Posterior vitreous detachment Vitreous syneresis         Fundus Exam       Right Left   Disc Compact, tilted, Pink and Sharp, PPA Pink and sharp, Compact, Tilted disc, temp PPA   C/D Ratio 0.3 0.6   Macula Blunted foveal reflex, central Drusen, RPE mottling and clumping, persistent large central PED with overlying SRH superior and inferior borders Flat, Good foveal reflex, early atrophy   Vessels attenuated, mild tortuosity attenuated, Tortuous   Periphery Attached, No RT/RD, peripheral cystoid degeneration Attached           Refraction     Manifest Refraction       Sphere Cylinder Axis Dist VA   Right -2.50 +0.50 165 20/100   Left -3.25 +0.50 145 20/25+1           IMAGING  AND PROCEDURES  Imaging and Procedures for  04/05/2023  OCT, Retina - OU - Both Eyes       Right Eye Quality was good. Central Foveal Thickness: 453. Progression has been stable. Findings include no IRF, abnormal foveal contour, retinal drusen , subretinal hyper-reflective material, intraretinal hyper-reflective material, pigment epithelial detachment, subretinal fluid (Persistent central large PED with positive SRHM; mild interval improvement in SRF).   Left Eye Quality was good. Central Foveal Thickness: 294. Progression has been stable. Findings include normal foveal contour, no IRF, no SRF, retinal drusen , subretinal hyper-reflective material, pigment epithelial detachment, outer retinal atrophy.   Notes *Images captured and stored on drive  Diagnosis / Impression:  OD: Persistent central large PED with positive SRHM; mild interval improvement in SRF OS: Non-exu ARMD -- stable  Clinical management:  See below  Abbreviations: NFP - Normal foveal profile. CME - cystoid macular edema. PED - pigment epithelial detachment. IRF - intraretinal fluid. SRF - subretinal fluid. EZ - ellipsoid zone. ERM - epiretinal membrane. ORA - outer retinal atrophy. ORT - outer retinal tubulation. SRHM - subretinal hyper-reflective material. IRHM - intraretinal hyper-reflective material      Intravitreal Injection, Pharmacologic Agent - OD - Right Eye       Time Out 04/05/2023. 10:04 AM. Confirmed correct patient, procedure, site, and patient consented.   Anesthesia Topical anesthesia was used. Anesthetic medications included Lidocaine 2%, Proparacaine 0.5%.   Procedure Preparation included 5% betadine to ocular surface, eyelid speculum. A supplied (32g) needle was used.   Injection: 1.25 mg Bevacizumab 1.25mg /0.38ml   Route: Intravitreal, Site: Right Eye   NDC: [id number], Lot: [id number], Expiration date: 05/08/2023   Post-op Post injection exam found visual acuity of at least counting  fingers. The patient tolerated the procedure well. There were no complications. The patient received written and verbal post procedure care education. Post injection medications were not given.            ASSESSMENT/PLAN:    ICD-10-CM   1. Exudative age-related macular degeneration of right eye with active choroidal neovascularization (HCC)  H35.3211 OCT, Retina - OU - Both Eyes    Intravitreal Injection, Pharmacologic Agent - OD - Right Eye    Bevacizumab (AVASTIN) SOLN 1.25 mg    2. Intermediate stage nonexudative age-related macular degeneration of left eye  H35.3122     3. Essential hypertension  I10     4. Hypertensive retinopathy of both eyes  H35.033     5. Combined forms of age-related cataract of both eyes  H25.813      1. Exudative age related macular degeneration, right eye   - lost to follow up from 05.12.23 to 10.07.24 (~17 mos)  - delayed to follow up from 6 weeks to 11 weeks (11.08.22 to 01.24.23) - delayed to follow up from 4 weeks to 6 months from 12.14.21 to 06.20.22 -- did not return due to pain after last injection (12.14.21)  - pt initially reported brown spot in vision at 5 oclock visual field - IVA OD #1 (09.14.21), #2 (10.12.21), #3 (11.09.21), #4 (12.14.21), #5 (06.20.22), #6 (07.19.22), #7 (08.16.22), #8 (9.27.22), #9 (11.8.22), #10 (01.24.23), #11 (03.14.23), #12 (05.12.23)  - peripapillary CNV off ST disc  - exam shows +subretinal heme superior and inferior borders of large central PED - OCT shows Persistent central large PED with positive SRHM; mild interval improvement in SRF  - BCVA OD 20/150 - decreased from 20/80       - Recommend IVA OD #13 today - 10.07.24   - pt wishes  to be treated today  - RBA of procedure discussed, questions answered   - informed consent obtained and resigned 10.07.24  - see procedure note  - discussed importance of compliance with follow up and risk of permanatent vision loss with delays in treatment  - f/u 4-6 weeks,  DFE, OCT, possible injection  2. Age related macular degeneration, non-exudative, left eye - The incidence, anatomy, and pathology of dry AMD, risk of progression, and the AREDS and AREDS 2 study including smoking risks discussed with patient.    - recommend Amsler grid monitoring  3,4. Hypertensive retinopathy OU - discussed importance of tight BP control - monitor  5. Mixed Cataract OU - The symptoms of cataract, surgical options, and treatments and risks were discussed with patient. - discussed diagnosis and progression - clear from a retina standpoint to proceed with cataract surgery when both patient and surgeon are ready - refer to [facility] for cataract evaluation -- pt requested Dr. Raynelle Chary  Ophthalmic Meds Ordered this visit:  Meds ordered this encounter  Medications   Bevacizumab (AVASTIN) SOLN 1.25 mg     Return in about 4 weeks (around 05/03/2023) for f/u Ex. AMD OD, DFE, OCT, Possible, IVA, OD.  There are no Patient Instructions on file for this visit.  This document serves as a record of services personally performed by Karie Chimera, MD, PhD. It was created on their behalf by Annalee Genta, COMT. The creation of this record is the provider's dictation and/or activities during the visit.  Electronically signed by: Annalee Genta, COMT 04/05/23 9:06 PM  This document serves as a record of services personally performed by Karie Chimera, MD, PhD. It was created on their behalf by Charlette Caffey, COT an ophthalmic technician. The creation of this record is the provider's dictation and/or activities during the visit.    Electronically signed by:  Charlette Caffey, COT  04/05/23 9:06 PM  Karie Chimera, M.D., Ph.D. Diseases & Surgery of the Retina and Vitreous [facility]  I have reviewed the above documentation for accuracy and completeness, and I agree with the above. Karie Chimera, M.D., Ph.D. 04/05/23 9:12  PM  Abbreviations: M myopia (nearsighted); A astigmatism; H hyperopia (farsighted); P presbyopia; Mrx spectacle prescription;  CTL contact lenses; OD right eye; OS left eye; OU both eyes  XT exotropia; ET esotropia; PEK punctate epithelial keratitis; PEE punctate epithelial erosions; DES dry eye syndrome; MGD meibomian gland dysfunction; ATs artificial tears; PFAT's preservative free artificial tears; NSC nuclear sclerotic cataract; PSC posterior subcapsular cataract; ERM epi-retinal membrane; PVD posterior vitreous detachment; RD retinal detachment; DM diabetes mellitus; DR diabetic retinopathy; NPDR non-proliferative diabetic retinopathy; PDR proliferative diabetic retinopathy; CSME clinically significant macular edema; DME diabetic macular edema; dbh dot blot hemorrhages; CWS cotton wool spot; POAG primary open angle glaucoma; C/D cup-to-disc ratio; HVF humphrey visual field; GVF goldmann visual field; OCT optical coherence tomography; IOP intraocular pressure; BRVO Branch retinal vein occlusion; CRVO central retinal vein occlusion; CRAO central retinal artery occlusion; BRAO branch retinal artery occlusion; RT retinal tear; SB scleral buckle; PPV pars plana vitrectomy; VH Vitreous hemorrhage; PRP panretinal laser photocoagulation; IVK intravitreal kenalog; VMT vitreomacular traction; MH Macular hole;  NVD neovascularization of the disc; NVE neovascularization elsewhere; AREDS age related eye disease study; ARMD age related macular degeneration; POAG primary open angle glaucoma; EBMD epithelial/anterior basement membrane dystrophy; ACIOL anterior chamber intraocular lens; IOL intraocular lens; PCIOL posterior chamber intraocular lens; Phaco/IOL phacoemulsification with  intraocular lens placement; PRK photorefractive keratectomy; LASIK laser assisted in situ keratomileusis; HTN hypertension; DM diabetes mellitus; COPD chronic obstructive pulmonary disease

## 2023-04-05 ENCOUNTER — Ambulatory Visit (INDEPENDENT_AMBULATORY_CARE_PROVIDER_SITE_OTHER): Payer: Medicare HMO | Admitting: Ophthalmology

## 2023-04-05 ENCOUNTER — Encounter (INDEPENDENT_AMBULATORY_CARE_PROVIDER_SITE_OTHER): Payer: Self-pay | Admitting: Ophthalmology

## 2023-04-05 DIAGNOSIS — H25813 Combined forms of age-related cataract, bilateral: Secondary | ICD-10-CM

## 2023-04-05 DIAGNOSIS — I10 Essential (primary) hypertension: Secondary | ICD-10-CM | POA: Diagnosis not present

## 2023-04-05 DIAGNOSIS — H35033 Hypertensive retinopathy, bilateral: Secondary | ICD-10-CM | POA: Diagnosis not present

## 2023-04-05 DIAGNOSIS — H353211 Exudative age-related macular degeneration, right eye, with active choroidal neovascularization: Secondary | ICD-10-CM | POA: Diagnosis not present

## 2023-04-05 DIAGNOSIS — H353122 Nonexudative age-related macular degeneration, left eye, intermediate dry stage: Secondary | ICD-10-CM | POA: Diagnosis not present

## 2023-04-05 MED ORDER — BEVACIZUMAB CHEMO INJECTION 1.25MG/0.05ML SYRINGE FOR KALEIDOSCOPE
1.2500 mg | INTRAVITREAL | Status: AC | PRN
  Administered 2023-04-05: 1.25 mg via INTRAVITREAL

## 2023-04-27 DIAGNOSIS — H353122 Nonexudative age-related macular degeneration, left eye, intermediate dry stage: Secondary | ICD-10-CM | POA: Diagnosis not present

## 2023-04-27 DIAGNOSIS — H353211 Exudative age-related macular degeneration, right eye, with active choroidal neovascularization: Secondary | ICD-10-CM | POA: Diagnosis not present

## 2023-04-27 DIAGNOSIS — H2513 Age-related nuclear cataract, bilateral: Secondary | ICD-10-CM | POA: Diagnosis not present

## 2023-05-17 ENCOUNTER — Encounter (INDEPENDENT_AMBULATORY_CARE_PROVIDER_SITE_OTHER): Payer: Medicare HMO | Admitting: Ophthalmology

## 2023-05-17 DIAGNOSIS — H353211 Exudative age-related macular degeneration, right eye, with active choroidal neovascularization: Secondary | ICD-10-CM

## 2023-05-17 DIAGNOSIS — H25813 Combined forms of age-related cataract, bilateral: Secondary | ICD-10-CM

## 2023-05-17 DIAGNOSIS — I10 Essential (primary) hypertension: Secondary | ICD-10-CM

## 2023-05-17 DIAGNOSIS — H353122 Nonexudative age-related macular degeneration, left eye, intermediate dry stage: Secondary | ICD-10-CM

## 2023-05-17 DIAGNOSIS — H35033 Hypertensive retinopathy, bilateral: Secondary | ICD-10-CM

## 2023-08-16 DIAGNOSIS — S42301A Unspecified fracture of shaft of humerus, right arm, initial encounter for closed fracture: Secondary | ICD-10-CM | POA: Diagnosis not present

## 2023-08-16 DIAGNOSIS — W010XXA Fall on same level from slipping, tripping and stumbling without subsequent striking against object, initial encounter: Secondary | ICD-10-CM | POA: Diagnosis not present

## 2023-08-16 DIAGNOSIS — M25712 Osteophyte, left shoulder: Secondary | ICD-10-CM | POA: Diagnosis not present

## 2023-08-16 DIAGNOSIS — W19XXXA Unspecified fall, initial encounter: Secondary | ICD-10-CM | POA: Diagnosis not present

## 2023-08-16 DIAGNOSIS — Z043 Encounter for examination and observation following other accident: Secondary | ICD-10-CM | POA: Diagnosis not present

## 2023-08-16 DIAGNOSIS — M85811 Other specified disorders of bone density and structure, right shoulder: Secondary | ICD-10-CM | POA: Diagnosis not present

## 2023-08-16 DIAGNOSIS — I10 Essential (primary) hypertension: Secondary | ICD-10-CM | POA: Diagnosis not present

## 2023-08-16 DIAGNOSIS — R531 Weakness: Secondary | ICD-10-CM | POA: Diagnosis not present

## 2023-08-16 DIAGNOSIS — M8580 Other specified disorders of bone density and structure, unspecified site: Secondary | ICD-10-CM | POA: Diagnosis not present

## 2023-08-16 DIAGNOSIS — S0990XA Unspecified injury of head, initial encounter: Secondary | ICD-10-CM | POA: Diagnosis not present

## 2023-08-16 DIAGNOSIS — S42211A Unspecified displaced fracture of surgical neck of right humerus, initial encounter for closed fracture: Secondary | ICD-10-CM | POA: Diagnosis not present

## 2023-08-16 DIAGNOSIS — M25511 Pain in right shoulder: Secondary | ICD-10-CM | POA: Diagnosis not present

## 2023-08-16 DIAGNOSIS — R609 Edema, unspecified: Secondary | ICD-10-CM | POA: Diagnosis not present

## 2023-08-16 DIAGNOSIS — Z8679 Personal history of other diseases of the circulatory system: Secondary | ICD-10-CM | POA: Diagnosis not present

## 2023-08-24 DIAGNOSIS — S42201A Unspecified fracture of upper end of right humerus, initial encounter for closed fracture: Secondary | ICD-10-CM | POA: Diagnosis not present

## 2023-08-24 DIAGNOSIS — M25511 Pain in right shoulder: Secondary | ICD-10-CM | POA: Diagnosis not present

## 2023-09-09 DIAGNOSIS — M25511 Pain in right shoulder: Secondary | ICD-10-CM | POA: Diagnosis not present

## 2023-10-07 DIAGNOSIS — S42201A Unspecified fracture of upper end of right humerus, initial encounter for closed fracture: Secondary | ICD-10-CM | POA: Diagnosis not present

## 2023-11-15 ENCOUNTER — Ambulatory Visit: Attending: Internal Medicine | Admitting: Internal Medicine

## 2023-11-15 VITALS — BP 134/88 | HR 63 | Ht 61.0 in | Wt 104.2 lb

## 2023-11-15 DIAGNOSIS — I480 Paroxysmal atrial fibrillation: Secondary | ICD-10-CM | POA: Diagnosis not present

## 2023-11-15 NOTE — Patient Instructions (Signed)

## 2023-11-15 NOTE — Progress Notes (Signed)
 Cardiology Office Note  Date: 11/15/2023   ID: Cassidy Miller, DOB 20-Mar-1946, MRN [id number]  PCP:  [facility]  Cardiologist:  None Electrophysiologist:  None   History of Present Illness: Cassidy Miller is a 78 y.o. female known to have paroxysmal A-fib, quiescent ulcerative colitis, vertigo is here for follow-up visit.  Per chart review, she goes into A-fib whenever she gets sick/ill.  She noticed previously/recently that whenever her potassium drops low, her heart flutters.  So she started eating banana, green grapes after which she did not have any palpitations.  Compliant with medications.  No angina, DOE, syncope, leg swelling.  She does have dizziness but this is from vertigo and cannot differentiate if it is true exertional dizziness.  No bleeding complications except that she has occasional bleeding spells from ulcerative colitis.  Does not take any medications for ulcerative colitis.  Currently not on any systemic AC.  Past Medical History:  Diagnosis Date   Arrhythmia    afib   Atrial fibrillation (HCC)    Cataract    Mixed form OU   Hypertensive retinopathy    OU   Macular degeneration    OU   Meniere disease    Raynaud disease    Ulcerative colitis (HCC)     Past Surgical History:  Procedure Laterality Date   TONSILLECTOMY AND ADENOIDECTOMY      Current Outpatient Medications  Medication Sig Dispense Refill   flecainide  (TAMBOCOR ) 50 MG tablet TAKE ONE (1) TABLET BY MOUTH TWO (2) TIMES DAILY 180 tablet 0   meclizine  (ANTIVERT ) 12.5 MG tablet Take 12.5 mg by mouth 3 (three) times daily as needed for dizziness.     metoprolol  succinate (TOPROL -XL) 25 MG 24 hr tablet TAKE ONE (1) TABLET BY MOUTH TWO (2) TIMES DAILY 180 tablet 0   Multiple Vitamins-Minerals (PRESERVISION AREDS PO) Take 1 tablet by mouth daily.     No current facility-administered medications for this visit.   Allergies:  Peanut-containing drug products, Penicillins, Asa [aspirin],  Bactrim [sulfamethoxazole-trimethoprim], Biaxin [clarithromycin], Cortisone, Erythromycin, and Tylenol [acetaminophen]   Social History: The patient  reports that she quit smoking about 54 years ago. Her smoking use included cigarettes. She started smoking about 64 years ago. She has a 10 pack-year smoking history. She has never used smokeless tobacco. She reports that she does not drink alcohol  and does not use drugs.   Family History: The patient's family history includes Aortic aneurysm in her maternal aunt and mother; Coronary artery disease (age of onset: 24) in her paternal grandmother; Heart attack in her paternal grandmother; Lung disease in her father.   ROS:  Please see the history of present illness. Otherwise, complete review of systems is positive for none  All other systems are reviewed and negative.   Physical Exam: VS:  BP 134/88   Pulse 63   Ht 5\' 1"  (1.549 m)   Wt 104 lb 3.2 oz (47.3 kg)   SpO2 96%   BMI 19.69 kg/m , BMI Body mass index is 19.69 kg/m.  Wt Readings from Last 3 Encounters:  11/15/23 104 lb 3.2 oz (47.3 kg)  01/28/22 102 lb 3.2 oz (46.4 kg)  10/28/20 116 lb (52.6 kg)    General: Patient appears comfortable at rest. HEENT: Conjunctiva and lids normal, oropharynx clear with moist mucosa. Neck: Supple, no elevated JVP or carotid bruits, no thyromegaly. Lungs: Clear to auscultation, nonlabored breathing at rest. Cardiac: Regular rate and rhythm, no S3 or significant systolic  murmur, no pericardial rub. Abdomen: Soft, nontender, no hepatomegaly, bowel sounds present, no guarding or rebound. Extremities: No pitting edema, distal pulses 2+. Skin: Warm and dry. Musculoskeletal: No kyphosis. Neuropsychiatric: Alert and oriented x3, affect grossly appropriate.  Recent Labwork: No results found for requested labs within last 365 days.  No results found for: "CHOL", "TRIG", "HDL", "CHOLHDL", "VLDL", "LDLCALC", "LDLDIRECT"   Assessment and Plan:  Paroxysmal  A-fib: Occasional palpitations.  She goes into A-fib only when she her electrolytes are severely abnormal.  Once she eats banana, she would not have any more palpitations.  Continue flecainide  50 mg twice daily (did not tolerate higher dose previously) and metoprolol  succinate 25 mg once daily.  EKG today showed NSR, HR 60 bpm, PR interval 180 ms and QRS 100 ms.  QRS has not worsened in the last 2 years.  Refused systemic AC due to ulcerative colitis (she occasionally bleeds due to ulcerative colitis). Discussed the risk and benefits of being on systemic AC.  Discussed watchman implantation, will defer for now.       Medication Adjustments/Labs and Tests Ordered: Current medicines are reviewed at length with the patient today.  Concerns regarding medicines are outlined above.    Disposition:  Follow up 1 year  Signed Novah Nessel Beauford Bounds, MD, 11/15/2023 9:44 AM    [facility] [address] [place], [place], [place] [address]

## 2023-12-20 DIAGNOSIS — M810 Age-related osteoporosis without current pathological fracture: Secondary | ICD-10-CM | POA: Diagnosis not present

## 2023-12-20 DIAGNOSIS — Z1331 Encounter for screening for depression: Secondary | ICD-10-CM | POA: Diagnosis not present

## 2023-12-20 DIAGNOSIS — K2100 Gastro-esophageal reflux disease with esophagitis, without bleeding: Secondary | ICD-10-CM | POA: Diagnosis not present

## 2023-12-20 DIAGNOSIS — L209 Atopic dermatitis, unspecified: Secondary | ICD-10-CM | POA: Diagnosis not present

## 2023-12-20 DIAGNOSIS — I480 Paroxysmal atrial fibrillation: Secondary | ICD-10-CM | POA: Diagnosis not present

## 2023-12-20 DIAGNOSIS — Z0001 Encounter for general adult medical examination with abnormal findings: Secondary | ICD-10-CM | POA: Diagnosis not present

## 2023-12-20 DIAGNOSIS — Z1329 Encounter for screening for other suspected endocrine disorder: Secondary | ICD-10-CM | POA: Diagnosis not present

## 2023-12-20 DIAGNOSIS — Z13220 Encounter for screening for lipoid disorders: Secondary | ICD-10-CM | POA: Diagnosis not present

## 2023-12-20 DIAGNOSIS — Z1389 Encounter for screening for other disorder: Secondary | ICD-10-CM | POA: Diagnosis not present
